# Patient Record
Sex: Male | Born: 1975 | Race: White | Hispanic: No | Marital: Married | Smoking: Current every day smoker
Health system: Southern US, Community
[De-identification: ages and names within clinical notes are randomized; demographics above are authoritative.]

## PROBLEM LIST (undated history)

## (undated) DIAGNOSIS — I10 Essential (primary) hypertension: Secondary | ICD-10-CM

## (undated) DIAGNOSIS — N200 Calculus of kidney: Secondary | ICD-10-CM

## (undated) DIAGNOSIS — E7800 Pure hypercholesterolemia, unspecified: Secondary | ICD-10-CM

## (undated) DIAGNOSIS — G4730 Sleep apnea, unspecified: Secondary | ICD-10-CM

## (undated) DIAGNOSIS — K439 Ventral hernia without obstruction or gangrene: Secondary | ICD-10-CM

## (undated) DIAGNOSIS — R0600 Dyspnea, unspecified: Secondary | ICD-10-CM

## (undated) DIAGNOSIS — K760 Fatty (change of) liver, not elsewhere classified: Secondary | ICD-10-CM

## (undated) DIAGNOSIS — Z87442 Personal history of urinary calculi: Secondary | ICD-10-CM

## (undated) HISTORY — PX: VASECTOMY: SHX75

## (undated) HISTORY — DX: Ventral hernia without obstruction or gangrene: K43.9

## (undated) HISTORY — DX: Fatty (change of) liver, not elsewhere classified: K76.0

## (undated) HISTORY — DX: Calculus of kidney: N20.0

## (undated) HISTORY — PX: WISDOM TOOTH EXTRACTION: SHX21

## (undated) HISTORY — PX: COLONOSCOPY: SHX174

---
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
## 2002-11-16 ENCOUNTER — Emergency Department (HOSPITAL_COMMUNITY): Admission: EM | Admit: 2002-11-16 | Discharge: 2002-11-16 | Payer: Self-pay | Admitting: Emergency Medicine

## 2005-01-23 ENCOUNTER — Ambulatory Visit: Admission: RE | Admit: 2005-01-23 | Discharge: 2005-01-23 | Payer: Self-pay | Admitting: Otolaryngology

## 2005-02-03 ENCOUNTER — Ambulatory Visit: Payer: Self-pay | Admitting: Pulmonary Disease

## 2005-12-18 ENCOUNTER — Ambulatory Visit: Payer: Self-pay | Admitting: Orthopedic Surgery

## 2005-12-28 ENCOUNTER — Encounter: Admission: RE | Admit: 2005-12-28 | Discharge: 2005-12-28 | Payer: Self-pay | Admitting: Orthopedic Surgery

## 2007-08-22 DIAGNOSIS — N200 Calculus of kidney: Secondary | ICD-10-CM

## 2007-08-22 HISTORY — DX: Calculus of kidney: N20.0

## 2007-12-14 ENCOUNTER — Emergency Department (HOSPITAL_COMMUNITY): Admission: EM | Admit: 2007-12-14 | Discharge: 2007-12-14 | Payer: Self-pay | Admitting: Emergency Medicine

## 2011-01-06 NOTE — Consult Note (Signed)
   NAME:  Johnathan Parsons, Johnathan Parsons                      ACCOUNT NO.:  [id number]   MEDICAL RECORD NO.:  [id number]                   PATIENT TYPE:  EMS   LOCATION:  ED                                   FACILITY:  [facility]   PHYSICIAN:  Dirk Dress. Katrinka Blazing, M.D.                DATE OF BIRTH:  03-22-1976   DATE OF CONSULTATION:  11/16/2002  DATE OF DISCHARGE:  11/16/2002                                   CONSULTATION   EMERGENCY ROOM SURGERY CONSULTATION:   HISTORY:  The patient is a 35 year old male with swelling of his right  perianal area and buttock since 11/13/2002.  He has had increasingly severe  pain with swelling.  He had no fever or chills and no drainage.  He was  evaluated in the emergency room and was found to have a large perirectal  abscess.  He was counseled for I&D under local anesthesia.  The area was  prepped and draped with Betadine.  Local infiltration with 1% Xylocaine was  carried out using 40 mL total.  The area was initially aspirated with  drainage of about 20 mL of thick tannish pus.  A #11-blade was then used to  drain into the abscess cavity.  Another 30 mL of pus was removed.  The area  was then flushed with peroxide and Betadine.  A 1/4-inch Penrose drain was  placed and sutured in place with 4-0 nylon.  Dressing was applied.   IMPRESSION:  Perirectal abscess.   PROCEDURE:  Incision and drainage of perirectal abscess.   RECOMMENDATION:  The patient is to wear peripad to collect the drainage.  He  is to have followup in the office on 11/19/2002.  He is given doxycycline  100 mg b.i.d. for 2 weeks.  He is advised to be out of work at least 7 days  starting today.                                               Dirk Dress. Katrinka Blazing, M.D.    LCS/MEDQ  D:  11/16/2002  T:  11/16/2002  Job:  [id number]

## 2011-01-06 NOTE — Procedures (Signed)
Johnathan Parsons, Johnathan Parsons NO.:  [id number]   MEDICAL RECORD NO.:  [id number]          PATIENT TYPE:  OUT   LOCATION:  SLEEP LAB                     FACILITY:  [facility]   PHYSICIAN:  Marcelyn Bruins, M.D. [facility] DATE OF BIRTH:  1976/05/13   DATE OF STUDY:  01/23/2005                              NOCTURNAL POLYSOMNOGRAM   REFERRING PHYSICIAN:  Suzanna Obey, M.D.   INDICATIONS:  Hypersomnia with sleep apnea.   SLEEP ARCHITECTURE:  The patient had total sleep time of 352 minutes with  absent slow wave sleep and decreased REM. Sleep onset latency was prolonged  at 40 minutes and REM latency was quite prolonged as well. Sleep efficiency  was 87%.   IMPRESSION:  1.  Split night study reveals mild obstructive sleep apnea/hypopnea syndrome      with 42 obstructive events noted in the first 154 minutes of sleep. This      gave the patient a respiratory disturbance index of 16 events per hour      and O2 desaturation as low as 86%. The events were not positional but      were associated with moderate to loud snoring. By protocol, the patient      was placed on a small Respironics nasal C-PAP mask and C-PAP titration      was initiated. At a pressure of 7 cm, the patient had no further      obstructive events but there was persistent mild snoring. I would      suggest a final treatment pressure of 8-10 cm depending upon clinical      response.  2.  No clinically significant cardiac arrhythmias.  3.  Moderate numbers of leg jerks with very mild sleep disruption.     ______________________________  Suzzette Righter    KC/MEDQ  D:  02/03/2005 15:51:45  T:  02/04/2005 06:53:24  Job:  [id number]

## 2011-05-16 LAB — URINALYSIS, ROUTINE W REFLEX MICROSCOPIC
Ketones, ur: NEGATIVE
Leukocytes, UA: NEGATIVE
Nitrite: NEGATIVE
Protein, ur: NEGATIVE
Urobilinogen, UA: 0.2
pH: 5

## 2011-05-16 LAB — URINE MICROSCOPIC-ADD ON

## 2011-12-03 ENCOUNTER — Encounter (HOSPITAL_COMMUNITY): Payer: Self-pay | Admitting: *Deleted

## 2011-12-03 ENCOUNTER — Emergency Department (HOSPITAL_COMMUNITY)
Admission: EM | Admit: 2011-12-03 | Discharge: 2011-12-04 | Disposition: A | Discharge status: Home / Self Care | Payer: BC Managed Care – PPO | Attending: Emergency Medicine | Admitting: Emergency Medicine

## 2011-12-03 DIAGNOSIS — F17200 Nicotine dependence, unspecified, uncomplicated: Secondary | ICD-10-CM | POA: Insufficient documentation

## 2011-12-03 DIAGNOSIS — T370X5A Adverse effect of sulfonamides, initial encounter: Secondary | ICD-10-CM | POA: Insufficient documentation

## 2011-12-03 DIAGNOSIS — T7840XA Allergy, unspecified, initial encounter: Secondary | ICD-10-CM

## 2011-12-03 DIAGNOSIS — L270 Generalized skin eruption due to drugs and medicaments taken internally: Secondary | ICD-10-CM

## 2011-12-03 DIAGNOSIS — R509 Fever, unspecified: Secondary | ICD-10-CM | POA: Insufficient documentation

## 2011-12-03 MED ORDER — METHYLPREDNISOLONE SODIUM SUCC 125 MG IJ SOLR
125.0000 mg | Freq: Once | INTRAMUSCULAR | Status: AC
  Administered 2011-12-03: 125 mg via INTRAVENOUS
  Filled 2011-12-03: qty 2

## 2011-12-03 MED ORDER — DIPHENHYDRAMINE HCL 50 MG/ML IJ SOLN
25.0000 mg | Freq: Once | INTRAMUSCULAR | Status: AC
  Administered 2011-12-03: 25 mg via INTRAVENOUS
  Filled 2011-12-03: qty 1

## 2011-12-03 MED ORDER — FAMOTIDINE 20 MG PO TABS
20.0000 mg | ORAL_TABLET | Freq: Once | ORAL | Status: AC
  Administered 2011-12-03: 20 mg via ORAL
  Filled 2011-12-03: qty 1

## 2011-12-03 NOTE — ED Provider Notes (Signed)
History   CSN: [id number]  Arrival date & time 12/03/11  2144   First MD Initiated Contact with Patient 12/03/11 2304      Chief Complaint  Patient presents with  . Rash    HPI  History provided by the patient. Patient is a 36 year old male with no significant past medical history presents with complaints of rash and possible drug reaction. Patient reports recently being put on Bactrim for a sinus infection by PCP. Patient has been taking this for the past or days. Last night patient began to develop a diffuse erythematous rash or skin. Rash progressed and is more prominent today over her entire body. Patient denies any itching, swelling or throat, swelling of lips or tongue, difficulty breathing or shortness of breath. Patient did try taking some Benadryl to help rash without significant improvements. Patient also stopped taking Bactrim today. Patient denies having similar reactions in the past. Patient has no known allergies. Patient does also report feeling some subjective fever and chills at home today but did not take his temperature. Patient has no other complaints.    History reviewed. No pertinent past medical history.  History reviewed. No pertinent past surgical history.  No family history on file.  History  Substance Use Topics  . Smoking status: Current Everyday Smoker  . Smokeless tobacco: Not on file  . Alcohol Use: No      Review of Systems  Constitutional: Positive for fever and chills. Negative for appetite change.  HENT: Negative for neck pain.   Respiratory: Negative for cough and shortness of breath.   Cardiovascular: Negative for chest pain.  Gastrointestinal: Negative for nausea and vomiting.  Skin: Positive for rash.  Neurological: Negative for dizziness, light-headedness and headaches.    Allergies  Tylox  Home Medications   Current Outpatient Rx  Name Route Sig Dispense Refill  . DIPHENHYDRAMINE HCL 25 MG PO CAPS Oral Take 25 mg by mouth every  6 (six) hours as needed. For allergies    . SULFAMETHOXAZOLE-TMP DS 800-160 MG PO TABS Oral Take 1 tablet by mouth 2 (two) times daily. Take until 12/06/11      BP 136/85  Pulse 106  Temp 98.7 F (37.1 C)  Resp 18  SpO2 98%  Physical Exam  Nursing note and vitals reviewed. Constitutional: He is oriented to person, place, and time. He appears well-developed and well-nourished. No distress.  HENT:  Head: Normocephalic and atraumatic.  Mouth/Throat: Oropharynx is clear and moist.       No swelling of lips or tongue.  Neck: Normal range of motion. Neck supple.       No swelling or mass.  Cardiovascular: Normal rate and regular rhythm.   Pulmonary/Chest: Effort normal and breath sounds normal. No stridor. No respiratory distress. He has no wheezes. He has no rales.  Neurological: He is alert and oriented to person, place, and time.  Skin: Skin is warm. Rash noted.       Diffuse erythematous macular rash over entire body sparing palms, soles and face.  Psychiatric: He has a normal mood and affect. His behavior is normal.    ED Course  Procedures       1. Drug rash   2. Allergic reaction caused by a drug       MDM  11:03 PM patient seen and evaluated. Patient in no acute distress. Patient with normal respirations and O2 sats. This is respiratory distress or compromise.        Angus Seller,  PA 12/04/11 0550

## 2011-12-03 NOTE — ED Notes (Signed)
No respiratory difficulty 

## 2011-12-03 NOTE — ED Notes (Signed)
The pt has had a red raised rash over his body since last pm.  He has been on bactrim ds for 4 days.  He feel very bad at present.  Fever chills and the rash

## 2011-12-04 NOTE — Discharge Instructions (Signed)
You were seen and treated today for your rash. Your providers today feel your rash was caused from use of Bactrim. Bactrim as an antibiotic that contains sulfur medication. It is advised that you avoid medications with sulfur or avoid taking Bactrim in the future. Please followup with your primary care provider to discuss this further. Continue to use Benadryl for symptoms at home. If you develop any worsening symptoms, swelling of the throat, difficulty breathing or shortness of breath return to the emergency room.  Drug Rash Skin reactions can be caused by several different drugs. Allergy to the medicine can cause itching, hives, and other rashes. Sun exposure causes a red rash with some medicines. Mononucleosis virus can cause a similar red rash when you are taking antibiotics. Sometimes, the rash may be accompanied by pain. The drug rash may happen with new drugs or with medicines that you have been taking for a while. The rash cannot be spread from person to person. In most cases, the symptoms of a drug rash are gone within a few days of stopping the medicine. Your rash, including hives (urticaria), is most likely from the following medicines:  Antibiotics or antimicrobials.   Anticonvulsants or seizure medicines.   Antihypertensives or blood pressure medicines.   Antimalarials.   Antidepressants or depression medicines.   Antianxiety drugs.   Diuretics or water pills.   Nonsteroidal anti-inflammatory drugs.   Simvastatin.   Lithium.   Omeprazole.   Allopurinol.   Pseudoephedrine.   Amiodarone.   Packed red blood cells, when you get a blood transfusion.   Contrast media, such as when getting an imaging test (CT or CAT scan).  This drug list is not all inclusive, but drug rashes have been reported with all the medicines listed above.Your caregiver will tell you which medicines to avoid. If you react to a medicine, a similar or worse reaction can occur the next time you take  it. If you need to stop taking an antibiotic because of a drug rash, an alternative antibiotic may be needed to get rid of your infection. Antihistamine or cortisone drugs may be prescribed to help relieve your symptoms. Stay out of the sun until the rash is completely gone.  Be sure to let your caregiver know about your drug reaction. Do not take this medicine in the future. Call your caregiver if your drug rash does not improve within 3 to 4 days. SEEK IMMEDIATE MEDICAL CARE IF:   You develop breathing problems, swelling in the throat, or wheezing.   You have weakness, fainting, fever, and muscle or joint pains.   You develop blisters or peeling of skin, especially around the mouth.  Document Released: 09/14/2004 Document Revised: 07/27/2011 Document Reviewed: 06/25/2008 [facility] Patient Information 2012 [place], [place].  RESOURCE GUIDE  Dental Problems  Patients with Medicaid: [facility] [phone] [address]                                           [phone] [address] Phone:  [phone]  Phone:  [phone]  If unable to pay or uninsured, contact:  [facility] or [facility]. to become qualified for the adult dental clinic.  Chronic Pain Problems Contact [facility]  [phone] Patients need to be referred by their primary care doctor.  Insufficient Money for Medicine Contact United Way:  call "211" or [facility] 430-727-2673.  No Primary Care Doctor Call [facility]  [phone] Other agencies that provide inexpensive medical care    [facility]  [phone]    [facility]  [phone]    [facility]  [phone]    [facility]  [phone]    [facility]  [phone]    [facility]  [phone]  Psychological Services [facility]  [phone] [facility]  [phone] [facility]   [phone] (emergency services [phone])  Substance Abuse Resources [facility]  [phone] [facility] [phone])[phone] [facility] [phone] Floydene Flock 726-120-8780 Residential & Outpatient Substance Abuse Program  [phone]  Abuse/Neglect [facility] Child Abuse Hotline 781-254-7213 [facility] Child Abuse Hotline (484) 704-9233 (After Hours)  Emergency Shelter [facility] [phone]  Maternity Homes Room at the [place] [facility] [place] [phone] [facility] [phone]  MRSA Hotline #:   765-268-7620    [facility] Resources  [facility] [place]     United Way                          [facility] [address] [place]                       [address]      [address]  Blondell Reveal Phone:  [phone]                                   Phone:  [phone]                 Phone:  [phone]  [facility] Mental Health Phone:  [phone]  [facility] Child Abuse Hotline 801-690-4058 [phone] (After Hours)

## 2011-12-04 NOTE — ED Provider Notes (Signed)
Medical screening examination/treatment/procedure(s) were performed by non-physician practitioner and as supervising physician I was immediately available for consultation/collaboration.   Reilley Latorre A Vanassa Penniman, MD 12/04/11 0642 

## 2013-02-04 ENCOUNTER — Telehealth: Payer: Self-pay | Admitting: Family Medicine

## 2013-02-04 NOTE — Telephone Encounter (Signed)
Spoke with patient and he needed to be seen right away.  Leaving for vacation. Offered him our first available new patient slot and he stated he would just go to Urgent Care so he could be seen before leaving for beach. bb

## 2013-02-04 NOTE — Telephone Encounter (Signed)
Did Dr. Lorin Picket accept this patient as his own? Patient says that his mother is Cora Daniels and that he had his records transferred here when Dr. Milford Cage left. We have no record of this up front.

## 2013-02-04 NOTE — Telephone Encounter (Signed)
Mom is calling back to check on status.. She said that he needs to be seen today so that he can meet them at the beach tomorrow.

## 2013-02-11 ENCOUNTER — Encounter: Payer: Self-pay | Admitting: Family Medicine

## 2013-02-11 ENCOUNTER — Ambulatory Visit (INDEPENDENT_AMBULATORY_CARE_PROVIDER_SITE_OTHER): Payer: BC Managed Care – PPO | Admitting: Family Medicine

## 2013-02-11 VITALS — BP 148/94 | Temp 98.3°F | Wt 235.4 lb

## 2013-02-11 DIAGNOSIS — J209 Acute bronchitis, unspecified: Secondary | ICD-10-CM

## 2013-02-11 NOTE — Progress Notes (Signed)
  Subjective:    Patient ID: Johnathan Parsons, male    DOB: 07/14/1976, 37 y.o.   MRN: [id number]  Cough This is a new problem. The current episode started in the past 7 days. The problem has been gradually worsening. The cough is productive of purulent sputum. Associated symptoms include chills. Nothing aggravates the symptoms. He has tried nothing for the symptoms. The treatment provided moderate relief.   One ppd, no wheeziness. Bad cough at night. Rob DM. Finished z pl. Advise by the ER that he had pneumonia  Pos exposure  Review of Systems  Constitutional: Positive for chills.  Respiratory: Positive for cough.        Objective:   Physical Exam Alert mild malaise. Vitals reviewed. Blood pressure improved on repeat. Lungs clear. Heart regular rate and rhythm. HEENT normal.       Assessment & Plan:  Impression 1 subacute bronchitis. Plan encouraged to stop smoking. Augmentin 875 twice a day 10 days. Hycodan 1 teaspoon each bedtime when necessary for cough. Symptomatic care discussed. WSL screening yearly wellness exams discussed.

## 2013-10-25 ENCOUNTER — Encounter (HOSPITAL_COMMUNITY): Payer: Self-pay | Admitting: Emergency Medicine

## 2013-10-25 ENCOUNTER — Emergency Department (INDEPENDENT_AMBULATORY_CARE_PROVIDER_SITE_OTHER): Payer: BC Managed Care – PPO

## 2013-10-25 ENCOUNTER — Emergency Department (HOSPITAL_COMMUNITY)
Admission: EM | Admit: 2013-10-25 | Discharge: 2013-10-25 | Disposition: A | Discharge status: Home / Self Care | Payer: BC Managed Care – PPO | Source: Home / Self Care | Attending: Family Medicine | Admitting: Family Medicine

## 2013-10-25 DIAGNOSIS — J069 Acute upper respiratory infection, unspecified: Secondary | ICD-10-CM

## 2013-10-25 DIAGNOSIS — J019 Acute sinusitis, unspecified: Secondary | ICD-10-CM

## 2013-10-25 MED ORDER — AMOXICILLIN 500 MG PO CAPS: 1000.0000 mg | ORAL_CAPSULE | Freq: Three times a day (TID) | ORAL | Status: DC

## 2013-10-25 MED ORDER — IBUPROFEN 800 MG PO TABS
800.0000 mg | ORAL_TABLET | Freq: Once | ORAL | Status: AC
  Administered 2013-10-25: 800 mg via ORAL

## 2013-10-25 MED ORDER — IBUPROFEN 800 MG PO TABS
ORAL_TABLET | ORAL | Status: AC
  Filled 2013-10-25: qty 1

## 2013-10-25 MED ORDER — HYDROCOD POLST-CHLORPHEN POLST 10-8 MG/5ML PO LQCR: 5.0000 mL | Freq: Two times a day (BID) | ORAL | Status: DC | PRN

## 2013-10-25 NOTE — Discharge Instructions (Signed)
You chest xray today is normal. Rest, drink lots of liquids and take medications as directed. If not improving in 48 hours please arrange follow up with your primary doctor.  Sinusitis Sinusitis is redness, soreness, and swelling (inflammation) of the paranasal sinuses. Paranasal sinuses are air pockets within the bones of your face (beneath the eyes, the middle of the forehead, or above the eyes). In healthy paranasal sinuses, mucus is able to drain out, and air is able to circulate through them by way of your nose. However, when your paranasal sinuses are inflamed, mucus and air can become trapped. This can allow bacteria and other germs to grow and cause infection. Sinusitis can develop quickly and last only a short time (acute) or continue over a long period (chronic). Sinusitis that lasts for more than 12 weeks is considered chronic.  CAUSES  Causes of sinusitis include:  Allergies.  Structural abnormalities, such as displacement of the cartilage that separates your nostrils (deviated septum), which can decrease the air flow through your nose and sinuses and affect sinus drainage.  Functional abnormalities, such as when the small hairs (cilia) that line your sinuses and help remove mucus do not work properly or are not present. SYMPTOMS  Symptoms of acute and chronic sinusitis are the same. The primary symptoms are pain and pressure around the affected sinuses. Other symptoms include:  Upper toothache.  Earache.  Headache.  Bad breath.  Decreased sense of smell and taste.  A cough, which worsens when you are lying flat.  Fatigue.  Fever.  Thick drainage from your nose, which often is green and may contain pus (purulent).  Swelling and warmth over the affected sinuses. DIAGNOSIS  Your caregiver will perform a physical exam. During the exam, your caregiver may:  Look in your nose for signs of abnormal growths in your nostrils (nasal polyps).  Tap over the affected sinus to  check for signs of infection.  View the inside of your sinuses (endoscopy) with a special imaging device with a light attached (endoscope), which is inserted into your sinuses. If your caregiver suspects that you have chronic sinusitis, one or more of the following tests may be recommended:  Allergy tests.  Nasal culture A sample of mucus is taken from your nose and sent to a lab and screened for bacteria.  Nasal cytology A sample of mucus is taken from your nose and examined by your caregiver to determine if your sinusitis is related to an allergy. TREATMENT  Most cases of acute sinusitis are related to a viral infection and will resolve on their own within 10 days. Sometimes medicines are prescribed to help relieve symptoms (pain medicine, decongestants, nasal steroid sprays, or saline sprays).  However, for sinusitis related to a bacterial infection, your caregiver will prescribe antibiotic medicines. These are medicines that will help kill the bacteria causing the infection.  Rarely, sinusitis is caused by a fungal infection. In theses cases, your caregiver will prescribe antifungal medicine. For some cases of chronic sinusitis, surgery is needed. Generally, these are cases in which sinusitis recurs more than 3 times per year, despite other treatments. HOME CARE INSTRUCTIONS   Drink plenty of water. Water helps thin the mucus so your sinuses can drain more easily.  Use a humidifier.  Inhale steam 3 to 4 times a day (for example, sit in the bathroom with the shower running).  Apply a warm, moist washcloth to your face 3 to 4 times a day, or as directed by your caregiver.  Use saline nasal sprays to help moisten and clean your sinuses.  Take over-the-counter or prescription medicines for pain, discomfort, or fever only as directed by your caregiver. SEEK IMMEDIATE MEDICAL CARE IF:  You have increasing pain or severe headaches.  You have nausea, vomiting, or drowsiness.  You have  swelling around your face.  You have vision problems.  You have a stiff neck.  You have difficulty breathing. MAKE SURE YOU:   Understand these instructions.  Will watch your condition.  Will get help right away if you are not doing well or get worse. Document Released: 08/07/2005 Document Revised: 10/30/2011 Document Reviewed: 08/22/2011 [facility] Patient Information 2014 [place].

## 2013-10-25 NOTE — ED Notes (Signed)
Onset Thursday of sinus congestion-but did not feel bad.  Friday-increased congestion and feeling tired.  Today generalized aches, cough, cough gives sensation of "pulling chest apart".  Non-productive cough.  Episodes of hot and cold sensation

## 2013-10-25 NOTE — ED Provider Notes (Signed)
CSN: [id number]     Arrival date & time 10/25/13  1639 History   First MD Initiated Contact with Patient 10/25/13 1818     Chief Complaint  Patient presents with  . URI    Patient is a 38 y.o. male presenting with URI. The history is provided by the patient.  URI Presenting symptoms: congestion, cough, facial pain, fatigue, fever, rhinorrhea and sore throat   Presenting symptoms: no ear pain   Severity:  Severe Onset quality:  Gradual Duration:  3 days Timing:  Constant Progression:  Worsening Chronicity:  New Relieved by:  Nothing Ineffective treatments:  OTC medications Associated symptoms: headaches, myalgias and sinus pain   Associated symptoms: no neck pain, no sneezing, no swollen glands and no wheezing   Risk factors: not elderly, no chronic cardiac disease, no chronic kidney disease, no chronic respiratory disease, no diabetes mellitus, no immunosuppression, no recent illness, no recent travel and no sick contacts     History reviewed. No pertinent past medical history. History reviewed. No pertinent past surgical history. No family history on file. History  Substance Use Topics  . Smoking status: Current Every Day Smoker  . Smokeless tobacco: Not on file  . Alcohol Use: Yes    Review of Systems  Constitutional: Positive for fever, chills and fatigue.  HENT: Positive for congestion, rhinorrhea and sore throat. Negative for ear pain and sneezing.   Eyes: Negative.   Respiratory: Positive for cough. Negative for wheezing.   Cardiovascular: Negative.   Gastrointestinal: Negative.   Endocrine: Negative.   Genitourinary: Negative.   Musculoskeletal: Positive for myalgias. Negative for neck pain.  Allergic/Immunologic: Negative.   Neurological: Positive for headaches.  Hematological: Negative.   Psychiatric/Behavioral: Negative.     Allergies  Oxycodone-acetaminophen and Sulphur  Home Medications   Current Outpatient Rx  Name  Route  Sig  Dispense  Refill  .  guaifenesin (ROBITUSSIN) 100 MG/5ML syrup   Oral   Take 200 mg by mouth 3 (three) times daily as needed for cough.         . Pseudoeph-Doxylamine-DM-APAP (NYQUIL PO)   Oral   Take by mouth.         . Pseudoephedrine-APAP-DM (DAYQUIL PO)   Oral   Take by mouth.         . terbinafine (LAMISIL) 250 MG tablet   Oral   Take 250 mg by mouth daily.         Marland Kitchen. amoxicillin (AMOXIL) 500 MG capsule   Oral   Take 2 capsules (1,000 mg total) by mouth 3 (three) times daily.   60 capsule   0   . chlorpheniramine-HYDROcodone (TUSSIONEX PENNKINETIC ER) 10-8 MG/5ML LQCR   Oral   Take 5 mLs by mouth every 12 (twelve) hours as needed for cough.   50 mL   0    BP 148/85  Pulse 123  Temp(Src) 100.1 F (37.8 C) (Oral)  Resp 18  SpO2 98% Physical Exam  Constitutional: He is oriented to person, place, and time. He appears well-developed and well-nourished.  Non-toxic appearance. He has a sickly appearance. He does not appear ill. No distress.  HENT:  Head: Normocephalic and atraumatic.  Right Ear: Tympanic membrane, external ear and ear canal normal.  Left Ear: Tympanic membrane and ear canal normal.  Nose: Mucosal edema and sinus tenderness present. Right sinus exhibits maxillary sinus tenderness and frontal sinus tenderness. Left sinus exhibits maxillary sinus tenderness and frontal sinus tenderness.  Mouth/Throat: Uvula is midline, oropharynx  is clear and moist and mucous membranes are normal.  Eyes: Conjunctivae are normal.  Neck: Neck supple.  Cardiovascular: Tachycardia present.   Pulmonary/Chest: Effort normal. He has no wheezes. He has no rales. He exhibits no tenderness.  LUL rhonchi that clears w/ coughing  Neurological: He is alert and oriented to person, place, and time.  Skin: Skin is warm and dry.  Psychiatric: He has a normal mood and affect.    ED Course  Procedures (including critical care time) Labs Review Labs Reviewed - No data to display Imaging Review Dg  Chest 2 View  10/25/2013   CLINICAL DATA:  Cough and fever for 2 days.  EXAM: CHEST  2 VIEW  COMPARISON:  02/04/2013  FINDINGS: The heart size and mediastinal contours are within normal limits. Both lungs are clear. The visualized skeletal structures are unremarkable.  IMPRESSION: No active cardiopulmonary disease.   Electronically Signed   By: Amie Portland M.D.   On: 10/25/2013 19:20     MDM   1. Acute sinusitis   2. URI (upper respiratory infection)    CXR neg for acute findings. HPI & PE c/w acute sinusitis associated w/ URI.  Will treat w/ Amoxicillin and short course of medication for cough. Pt encouraged to f/u w/ PCP if not improving in 48 hours. Pt agreeable w/ plan.    Leanne Chang, NP 10/25/13 2131

## 2013-10-27 NOTE — ED Provider Notes (Signed)
Medical screening examination/treatment/procedure(s) were performed by resident physician or non-physician practitioner and as supervising physician I was immediately available for consultation/collaboration.   Davis Ambrosini DOUGLAS MD.   Rhya Shan D Doninique Lwin, MD 10/27/13 2032 

## 2014-05-25 ENCOUNTER — Ambulatory Visit (INDEPENDENT_AMBULATORY_CARE_PROVIDER_SITE_OTHER): Payer: BC Managed Care – PPO | Admitting: Family Medicine

## 2014-05-25 ENCOUNTER — Encounter: Payer: Self-pay | Admitting: Family Medicine

## 2014-05-25 VITALS — Temp 99.1°F | Ht 71.0 in | Wt 241.0 lb

## 2014-05-25 DIAGNOSIS — R109 Unspecified abdominal pain: Secondary | ICD-10-CM

## 2014-05-25 LAB — POCT URINALYSIS DIPSTICK
Spec Grav, UA: 1.02
UROBILINOGEN UA: 4
pH, UA: 6

## 2014-05-25 MED ORDER — METRONIDAZOLE 500 MG PO TABS: 500.0000 mg | ORAL_TABLET | Freq: Three times a day (TID) | ORAL | Status: DC

## 2014-05-25 MED ORDER — CIPROFLOXACIN HCL 500 MG PO TABS: 500.0000 mg | ORAL_TABLET | Freq: Two times a day (BID) | ORAL | Status: DC

## 2014-05-25 NOTE — Progress Notes (Signed)
   Subjective:    Patient ID: Johnathan BayFloyd E Lucente Jr., male    DOB: 01/10/1976, 38 y.o.   MRN: [id number]  Abdominal Pain This is a new problem. The current episode started in the past 7 days. Pain location: low abd pain. Associated symptoms include a fever. The pain is aggravated by certain positions.   Moderate abdominal discomfort patient states over the past few days has never had this before. Denies rectal bleeding denies vomiting denies sweats or chills. States it does hurt to move some. Especially when he wrote his 4 wheeler PMH benign  Review of Systems  Constitutional: Positive for fever.  Gastrointestinal: Positive for abdominal pain.       Objective:   Physical Exam Lungs are clear no crackles heart normal pulses normal abdomen is soft with left lower quadrant abdominal discomfort no guarding or rebound extremities no edema skin warm dry Prostate exam normal      Assessment & Plan:  Probable diverticulitis-I recommend lab work. Also recommend Cipro twice a day Flagyl 3 times a day if ongoing trouble may need CAT scan. Recheck in the office in 3 days. Patient to go do lab work today will have results back by morning

## 2014-05-26 LAB — CBC WITH DIFFERENTIAL/PLATELET
BASOS ABS: 0 10*3/uL (ref 0.0–0.1)
Basophils Relative: 0 % (ref 0–1)
Eosinophils Absolute: 0.3 10*3/uL (ref 0.0–0.7)
Eosinophils Relative: 3 % (ref 0–5)
HEMATOCRIT: 43.7 % (ref 39.0–52.0)
HEMOGLOBIN: 14.9 g/dL (ref 13.0–17.0)
LYMPHS ABS: 3.3 10*3/uL (ref 0.7–4.0)
LYMPHS PCT: 31 % (ref 12–46)
MCH: 29.3 pg (ref 26.0–34.0)
MCHC: 34.1 g/dL (ref 30.0–36.0)
MCV: 86 fL (ref 78.0–100.0)
MONO ABS: 0.9 10*3/uL (ref 0.1–1.0)
MONOS PCT: 8 % (ref 3–12)
NEUTROS ABS: 6.2 10*3/uL (ref 1.7–7.7)
Neutrophils Relative %: 58 % (ref 43–77)
Platelets: 282 10*3/uL (ref 150–400)
RBC: 5.08 MIL/uL (ref 4.22–5.81)
RDW: 13 % (ref 11.5–15.5)
WBC: 10.7 10*3/uL — AB (ref 4.0–10.5)

## 2014-05-26 LAB — LIPASE: Lipase: 21 U/L (ref 0–75)

## 2014-05-26 LAB — HEPATIC FUNCTION PANEL
ALT: 35 U/L (ref 0–53)
AST: 21 U/L (ref 0–37)
Albumin: 4.1 g/dL (ref 3.5–5.2)
Alkaline Phosphatase: 94 U/L (ref 39–117)
BILIRUBIN DIRECT: 0.1 mg/dL (ref 0.0–0.3)
BILIRUBIN INDIRECT: 0.4 mg/dL (ref 0.2–1.2)
Total Bilirubin: 0.5 mg/dL (ref 0.2–1.2)
Total Protein: 6.5 g/dL (ref 6.0–8.3)

## 2014-05-26 LAB — BASIC METABOLIC PANEL
BUN: 10 mg/dL (ref 6–23)
CALCIUM: 9.2 mg/dL (ref 8.4–10.5)
CO2: 28 mEq/L (ref 19–32)
Chloride: 104 mEq/L (ref 96–112)
Creat: 0.97 mg/dL (ref 0.50–1.35)
Glucose, Bld: 69 mg/dL — ABNORMAL LOW (ref 70–99)
Potassium: 4.2 mEq/L (ref 3.5–5.3)
SODIUM: 140 meq/L (ref 135–145)

## 2014-05-28 ENCOUNTER — Encounter: Payer: Self-pay | Admitting: Family Medicine

## 2014-05-28 ENCOUNTER — Ambulatory Visit (INDEPENDENT_AMBULATORY_CARE_PROVIDER_SITE_OTHER): Payer: BC Managed Care – PPO | Admitting: Family Medicine

## 2014-05-28 VITALS — BP 122/90 | Ht 71.0 in | Wt 240.0 lb

## 2014-05-28 DIAGNOSIS — R350 Frequency of micturition: Secondary | ICD-10-CM

## 2014-05-28 DIAGNOSIS — R109 Unspecified abdominal pain: Secondary | ICD-10-CM

## 2014-05-28 DIAGNOSIS — Z139 Encounter for screening, unspecified: Secondary | ICD-10-CM

## 2014-05-28 LAB — POCT URINALYSIS DIPSTICK
Glucose, UA: NEGATIVE
KETONES UA: NEGATIVE
PH UA: 5
Protein, UA: NEGATIVE
Spec Grav, UA: <=1.005
Urobilinogen, UA: 2

## 2014-05-28 NOTE — Progress Notes (Signed)
   Subjective:    Patient ID: Johnathan BayFloyd E Burkley Jr., male    DOB: 1976/03/28, 38 y.o.   MRN: [id number]  HPI Patient is here for abdominal follow up. Patient states that his blood pressure has been elevated for the last week. Patient said his "bottom #'s" have been 150 & 108.  Patient also said his been urinating a lot more than usual lately. Urine sample collected. He relates the abdominal pain is not as severe as what it once was denies fever chills sweats nausea vomiting  Review of Systems  Constitutional: Negative for activity change, appetite change and fatigue.  HENT: Negative for congestion.   Respiratory: Negative for cough.   Cardiovascular: Negative for chest pain.  Gastrointestinal: Positive for abdominal pain.  Endocrine: Negative for polydipsia and polyphagia.  Neurological: Negative for weakness.  Psychiatric/Behavioral: Negative for confusion.       Objective:   Physical Exam  Vitals reviewed. Constitutional: He appears well-nourished. No distress.  Cardiovascular: Normal rate, regular rhythm and normal heart sounds.   No murmur heard. Pulmonary/Chest: Effort normal and breath sounds normal. No respiratory distress.  Abdominal: There is tenderness. There is no rebound and no guarding.  Musculoskeletal: He exhibits no edema.  Lymphadenopathy:    He has no cervical adenopathy.  Neurological: He is alert.  Psychiatric: His behavior is normal.          Assessment & Plan:  Increased urination recent lab work showed a normal glucose. Recommend anything other than monitoring. He is following up in several weeks  Elevated blood pressure reduce salt increase exercise no medications currently recheck his blood pressure on followup as well as exam  Lower abdominal pain probable diverticulitis somewhat improved if he is not 70% improved by next week or 100% improved by the time we see him he will need to have a CAT scan patient understands that  Repeat lab work  ordered

## 2014-05-28 NOTE — Patient Instructions (Signed)
DASH Eating Plan °DASH stands for "Dietary Approaches to Stop Hypertension." The DASH eating plan is a healthy eating plan that has been shown to reduce high blood pressure (hypertension). Additional health benefits may include reducing the risk of type 2 diabetes mellitus, heart disease, and stroke. The DASH eating plan may also help with weight loss. °WHAT DO I NEED TO KNOW ABOUT THE DASH EATING PLAN? °For the DASH eating plan, you will follow these general guidelines: °· Choose foods with a percent daily value for sodium of less than 5% (as listed on the food label). °· Use salt-free seasonings or herbs instead of table salt or sea salt. °· Check with your health care provider or pharmacist before using salt substitutes. °· Eat lower-sodium products, often labeled as "lower sodium" or "no salt added." °· Eat fresh foods. °· Eat more vegetables, fruits, and low-fat dairy products. °· Choose whole grains. Look for the word "whole" as the first word in the ingredient list. °· Choose fish and skinless chicken or turkey more often than red meat. Limit fish, poultry, and meat to 6 oz (170 g) each day. °· Limit sweets, desserts, sugars, and sugary drinks. °· Choose heart-healthy fats. °· Limit cheese to 1 oz (28 g) per day. °· Eat more home-cooked food and less restaurant, buffet, and fast food. °· Limit fried foods. °· Cook foods using methods other than frying. °· Limit canned vegetables. If you do use them, rinse them well to decrease the sodium. °· When eating at a restaurant, ask that your food be prepared with less salt, or no salt if possible. °WHAT FOODS CAN I EAT? °Seek help from a dietitian for individual calorie needs. °Grains °Whole grain or whole wheat bread. Brown rice. Whole grain or whole wheat pasta. Quinoa, bulgur, and whole grain cereals. Low-sodium cereals. Corn or whole wheat flour tortillas. Whole grain cornbread. Whole grain crackers. Low-sodium crackers. °Vegetables °Fresh or frozen vegetables  (raw, steamed, roasted, or grilled). Low-sodium or reduced-sodium tomato and vegetable juices. Low-sodium or reduced-sodium tomato sauce and paste. Low-sodium or reduced-sodium canned vegetables.  °Fruits °All fresh, canned (in natural juice), or frozen fruits. °Meat and Other Protein Products °Ground beef (85% or leaner), grass-fed beef, or beef trimmed of fat. Skinless chicken or turkey. Ground chicken or turkey. Pork trimmed of fat. All fish and seafood. Eggs. Dried beans, peas, or lentils. Unsalted nuts and seeds. Unsalted canned beans. °Dairy °Low-fat dairy products, such as skim or 1% milk, 2% or reduced-fat cheeses, low-fat ricotta or cottage cheese, or plain low-fat yogurt. Low-sodium or reduced-sodium cheeses. °Fats and Oils °Tub margarines without trans fats. Light or reduced-fat mayonnaise and salad dressings (reduced sodium). Avocado. Safflower, olive, or canola oils. Natural peanut or almond butter. °Other °Unsalted popcorn and pretzels. °The items listed above may not be a complete list of recommended foods or beverages. Contact your dietitian for more options. °WHAT FOODS ARE NOT RECOMMENDED? °Grains °White bread. White pasta. White rice. Refined cornbread. Bagels and croissants. Crackers that contain trans fat. °Vegetables °Creamed or fried vegetables. Vegetables in a cheese sauce. Regular canned vegetables. Regular canned tomato sauce and paste. Regular tomato and vegetable juices. °Fruits °Dried fruits. Canned fruit in light or heavy syrup. Fruit juice. °Meat and Other Protein Products °Fatty cuts of meat. Ribs, chicken wings, bacon, sausage, bologna, salami, chitterlings, fatback, hot dogs, bratwurst, and packaged luncheon meats. Salted nuts and seeds. Canned beans with salt. °Dairy °Whole or 2% milk, cream, half-and-half, and cream cheese. Whole-fat or sweetened yogurt. Full-fat   cheeses or blue cheese. Nondairy creamers and whipped toppings. Processed cheese, cheese spreads, or cheese  curds. °Condiments °Onion and garlic salt, seasoned salt, table salt, and sea salt. Canned and packaged gravies. Worcestershire sauce. Tartar sauce. Barbecue sauce. Teriyaki sauce. Soy sauce, including reduced sodium. Steak sauce. Fish sauce. Oyster sauce. Cocktail sauce. Horseradish. Ketchup and mustard. Meat flavorings and tenderizers. Bouillon cubes. Hot sauce. Tabasco sauce. Marinades. Taco seasonings. Relishes. °Fats and Oils °Butter, stick margarine, lard, shortening, ghee, and bacon fat. Coconut, palm kernel, or palm oils. Regular salad dressings. °Other °Pickles and olives. Salted popcorn and pretzels. °The items listed above may not be a complete list of foods and beverages to avoid. Contact your dietitian for more information. °WHERE CAN I FIND MORE INFORMATION? °[employer]: www.nhlbi.nih.gov/health/health-topics/topics/dash/ °Document Released: 07/27/2011 Document Revised: 12/22/2013 Document Reviewed: 06/11/2013 °[employer]® Patient Information ©2015 [employer]. This information is not intended to replace advice given to you by your health care provider. Make sure you discuss any questions you have with your health care provider. ° °

## 2014-05-30 LAB — CBC WITH DIFFERENTIAL/PLATELET
Basophils Absolute: 0 10*3/uL (ref 0.0–0.1)
Basophils Relative: 0 % (ref 0–1)
Eosinophils Absolute: 0.3 10*3/uL (ref 0.0–0.7)
Eosinophils Relative: 3 % (ref 0–5)
HEMATOCRIT: 45.5 % (ref 39.0–52.0)
HEMOGLOBIN: 15.4 g/dL (ref 13.0–17.0)
LYMPHS ABS: 2.3 10*3/uL (ref 0.7–4.0)
LYMPHS PCT: 25 % (ref 12–46)
MCH: 29.2 pg (ref 26.0–34.0)
MCHC: 33.8 g/dL (ref 30.0–36.0)
MCV: 86.2 fL (ref 78.0–100.0)
MONO ABS: 0.5 10*3/uL (ref 0.1–1.0)
MONOS PCT: 6 % (ref 3–12)
NEUTROS ABS: 6 10*3/uL (ref 1.7–7.7)
Neutrophils Relative %: 66 % (ref 43–77)
Platelets: 309 10*3/uL (ref 150–400)
RBC: 5.28 MIL/uL (ref 4.22–5.81)
RDW: 13.2 % (ref 11.5–15.5)
WBC: 9.1 10*3/uL (ref 4.0–10.5)

## 2014-05-30 LAB — LIPID PANEL
Cholesterol: 139 mg/dL (ref 0–200)
HDL: 28 mg/dL — AB (ref >39)
LDL CALC: 93 mg/dL (ref 0–99)
TRIGLYCERIDES: 90 mg/dL (ref <150)
Total CHOL/HDL Ratio: 5 Ratio
VLDL: 18 mg/dL (ref 0–40)

## 2014-06-11 ENCOUNTER — Encounter: Payer: Self-pay | Admitting: Family Medicine

## 2014-06-11 ENCOUNTER — Ambulatory Visit (INDEPENDENT_AMBULATORY_CARE_PROVIDER_SITE_OTHER): Payer: BC Managed Care – PPO | Admitting: Family Medicine

## 2014-06-11 VITALS — BP 150/98 | Ht 71.0 in | Wt 242.0 lb

## 2014-06-11 DIAGNOSIS — Z720 Tobacco use: Secondary | ICD-10-CM

## 2014-06-11 DIAGNOSIS — I10 Essential (primary) hypertension: Secondary | ICD-10-CM

## 2014-06-11 DIAGNOSIS — R1032 Left lower quadrant pain: Secondary | ICD-10-CM

## 2014-06-11 DIAGNOSIS — F17200 Nicotine dependence, unspecified, uncomplicated: Secondary | ICD-10-CM

## 2014-06-11 DIAGNOSIS — Z0000 Encounter for general adult medical examination without abnormal findings: Secondary | ICD-10-CM

## 2014-06-11 MED ORDER — LISINOPRIL 5 MG PO TABS: 5.0000 mg | ORAL_TABLET | Freq: Every day | ORAL | Status: DC

## 2014-06-11 NOTE — Patient Instructions (Addendum)
Smoking Cessation Quitting smoking is important to your health and has many advantages. However, it is not always easy to quit since nicotine is a very addictive drug. Oftentimes, people try 3 times or more before being able to quit. This document explains the best ways for you to prepare to quit smoking. Quitting takes hard work and a lot of effort, but you can do it. ADVANTAGES OF QUITTING SMOKING  You will live longer, feel better, and live better.  Your body will feel the impact of quitting smoking almost immediately.  Within 20 minutes, blood pressure decreases. Your pulse returns to its normal level.  After 8 hours, carbon monoxide levels in the blood return to normal. Your oxygen level increases.  After 24 hours, the chance of having a heart attack starts to decrease. Your breath, hair, and body stop smelling like smoke.  After 48 hours, damaged nerve endings begin to recover. Your sense of taste and smell improve.  After 72 hours, the body is virtually free of nicotine. Your bronchial tubes relax and breathing becomes easier.  After 2 to 12 weeks, lungs can hold more air. Exercise becomes easier and circulation improves.  The risk of having a heart attack, stroke, cancer, or lung disease is greatly reduced.  After 1 year, the risk of coronary heart disease is cut in half.  After 5 years, the risk of stroke falls to the same as a nonsmoker.  After 10 years, the risk of lung cancer is cut in half and the risk of other cancers decreases significantly.  After 15 years, the risk of coronary heart disease drops, usually to the level of a nonsmoker.  If you are pregnant, quitting smoking will improve your chances of having a healthy baby.  The people you live with, especially any children, will be healthier.  You will have extra money to spend on things other than cigarettes. QUESTIONS TO THINK ABOUT BEFORE ATTEMPTING TO QUIT You may want to talk about your answers with your  health care provider.  Why do you want to quit?  If you tried to quit in the past, what helped and what did not?  What will be the most difficult situations for you after you quit? How will you plan to handle them?  Who can help you through the tough times? Your family? Friends? A health care provider?  What pleasures do you get from smoking? What ways can you still get pleasure if you quit? Here are some questions to ask your health care provider:  How can you help me to be successful at quitting?  What medicine do you think would be best for me and how should I take it?  What should I do if I need more help?  What is smoking withdrawal like? How can I get information on withdrawal? GET READY  Set a quit date.  Change your environment by getting rid of all cigarettes, ashtrays, matches, and lighters in your home, car, or work. Do not let people smoke in your home.  Review your past attempts to quit. Think about what worked and what did not. GET SUPPORT AND ENCOURAGEMENT You have a better chance of being successful if you have help. You can get support in many ways.  Tell your family, friends, and coworkers that you are going to quit and need their support. Ask them not to smoke around you.  Get individual, group, or telephone counseling and support. Programs are available at local hospitals and health centers. Call   your local health department for information about programs in your area.  Spiritual beliefs and practices may help some smokers quit.  Download a "quit meter" on your computer to keep track of quit statistics, such as how long you have gone without smoking, cigarettes not smoked, and money saved.  Get a self-help book about quitting smoking and staying off tobacco. LEARN NEW SKILLS AND BEHAVIORS  Distract yourself from urges to smoke. Talk to someone, go for a walk, or occupy your time with a task.  Change your normal routine. Take a different route to work.  Drink tea instead of coffee. Eat breakfast in a different place.  Reduce your stress. Take a hot bath, exercise, or read a book.  Plan something enjoyable to do every day. Reward yourself for not smoking.  Explore interactive web-based programs that specialize in helping you quit. GET MEDICINE AND USE IT CORRECTLY Medicines can help you stop smoking and decrease the urge to smoke. Combining medicine with the above behavioral methods and support can greatly increase your chances of successfully quitting smoking.  Nicotine replacement therapy helps deliver nicotine to your body without the negative effects and risks of smoking. Nicotine replacement therapy includes nicotine gum, lozenges, inhalers, nasal sprays, and skin patches. Some may be available over-the-counter and others require a prescription.  Antidepressant medicine helps people abstain from smoking, but how this works is unknown. This medicine is available by prescription.  Nicotinic receptor partial agonist medicine simulates the effect of nicotine in your brain. This medicine is available by prescription. Ask your health care provider for advice about which medicines to use and how to use them based on your health history. Your health care provider will tell you what side effects to look out for if you choose to be on a medicine or therapy. Carefully read the information on the package. Do not use any other product containing nicotine while using a nicotine replacement product.  RELAPSE OR DIFFICULT SITUATIONS Most relapses occur within the first 3 months after quitting. Do not be discouraged if you start smoking again. Remember, most people try several times before finally quitting. You may have symptoms of withdrawal because your body is used to nicotine. You may crave cigarettes, be irritable, feel very hungry, cough often, get headaches, or have difficulty concentrating. The withdrawal symptoms are only temporary. They are strongest  when you first quit, but they will go away within 10-14 days. To reduce the chances of relapse, try to:  Avoid drinking alcohol. Drinking lowers your chances of successfully quitting.  Reduce the amount of caffeine you consume. Once you quit smoking, the amount of caffeine in your body increases and can give you symptoms, such as a rapid heartbeat, sweating, and anxiety.  Avoid smokers because they can make you want to smoke.  Do not let weight gain distract you. Many smokers will gain weight when they quit, usually less than 10 pounds. Eat a healthy diet and stay active. You can always lose the weight gained after you quit.  Find ways to improve your mood other than smoking. FOR MORE INFORMATION  www.smokefree.gov  Document Released: 08/01/2001 Document Revised: 12/22/2013 Document Reviewed: 11/16/2011 [facility] Patient Information 2015 [place], [place]. This information is not intended to    DASH Eating Plan DASH stands for "Dietary Approaches to Stop Hypertension." The DASH eating plan is a healthy eating plan that has been shown to reduce high blood pressure (hypertension). Additional health benefits may include reducing the risk of type 2 diabetes  mellitus, heart disease, and stroke. The DASH eating plan may also help with weight loss. WHAT DO I NEED TO KNOW ABOUT THE DASH EATING PLAN? For the DASH eating plan, you will follow these general guidelines:  Choose foods with a percent daily value for sodium of less than 5% (as listed on the food label).  Use salt-free seasonings or herbs instead of table salt or sea salt.  Check with your health care provider or pharmacist before using salt substitutes.  Eat lower-sodium products, often labeled as "lower sodium" or "no salt added."  Eat fresh foods.  Eat more vegetables, fruits, and low-fat dairy products.  Choose whole grains. Look for the word "whole" as the first word in the ingredient list.  Choose fish and skinless chicken or  [place]key more often than red meat. Limit fish, poultry, and meat to 6 oz (170 g) each day.  Limit sweets, desserts, sugars, and sugary drinks.  Choose heart-healthy fats.  Limit cheese to 1 oz (28 g) per day.  Eat more home-cooked food and less restaurant, buffet, and fast food.  Limit fried foods.  Cook foods using methods other than frying.  Limit canned vegetables. If you do use them, rinse them well to decrease the sodium.  When eating at a restaurant, ask that your food be prepared with less salt, or no salt if possible. WHAT FOODS CAN I EAT? Seek help from a dietitian for individual calorie needs. Grains Whole grain or whole wheat bread. Brown rice. Whole grain or whole wheat pasta. Quinoa, bulgur, and whole grain cereals. Low-sodium cereals. Corn or whole wheat flour tortillas. Whole grain cornbread. Whole grain crackers. Low-sodium crackers. Vegetables Fresh or frozen vegetables (raw, steamed, roasted, or grilled). Low-sodium or reduced-sodium tomato and vegetable juices. Low-sodium or reduced-sodium tomato sauce and paste. Low-sodium or reduced-sodium canned vegetables.  Fruits All fresh, canned (in natural juice), or frozen fruits. Meat and Other Protein Products Ground beef (85% or leaner), grass-fed beef, or beef trimmed of fat. Skinless chicken or [place]key. Ground chicken or [place]key. Pork trimmed of fat. All fish and seafood. Eggs. Dried beans, peas, or lentils. Unsalted nuts and seeds. Unsalted canned beans. Dairy Low-fat dairy products, such as skim or 1% milk, 2% or reduced-fat cheeses, low-fat ricotta or cottage cheese, or plain low-fat yogurt. Low-sodium or reduced-sodium cheeses. Fats and Oils Tub margarines without trans fats. Light or reduced-fat mayonnaise and salad dressings (reduced sodium). Avocado. Safflower, olive, or canola oils. Natural peanut or almond butter. Other Unsalted popcorn and pretzels. The items listed above may not be a complete list of  recommended foods or beverages. Contact your dietitian for more options. WHAT FOODS ARE NOT RECOMMENDED? Grains White bread. White pasta. White rice. Refined cornbread. Bagels and croissants. Crackers that contain trans fat. Vegetables Creamed or fried vegetables. Vegetables in a cheese sauce. Regular canned vegetables. Regular canned tomato sauce and paste. Regular tomato and vegetable juices. Fruits Dried fruits. Canned fruit in light or heavy syrup. Fruit juice. Meat and Other Protein Products Fatty cuts of meat. Ribs, chicken wings, bacon, sausage, bologna, salami, chitterlings, fatback, hot dogs, bratwurst, and packaged luncheon meats. Salted nuts and seeds. Canned beans with salt. Dairy Whole or 2% milk, cream, half-and-half, and cream cheese. Whole-fat or sweetened yogurt. Full-fat cheeses or blue cheese. Nondairy creamers and whipped toppings. Processed cheese, cheese spreads, or cheese curds. Condiments Onion and garlic salt, seasoned salt, table salt, and sea salt. Canned and packaged gravies. Worcestershire sauce. Tartar sauce. Barbecue sauce. Teriyaki sauce. Soy  sauce, including reduced sodium. Steak sauce. Fish sauce. Oyster sauce. Cocktail sauce. Horseradish. Ketchup and mustard. Meat flavorings and tenderizers. Bouillon cubes. Hot sauce. Tabasco sauce. Marinades. Taco seasonings. Relishes. Fats and Oils Butter, stick margarine, lard, shortening, ghee, and bacon fat. Coconut, palm kernel, or palm oils. Regular salad dressings. Other Pickles and olives. Salted popcorn and pretzels. The items listed above may not be a complete list of foods and beverages to avoid. Contact your dietitian for more information. WHERE CAN I FIND MORE INFORMATION? National Heart, Lung, and Blood Institute: CableProm[employer].itwww.nhlbi.nih.gov/health/health-topics/topics/dash/ Document Released: 07/27/2011 Document Revised: 12/22/2013 Document Reviewed: 06/11/2013 [facility] Patient Information 2015 [place], [place]. This  information is not intended to replace advice given to you by your health care provider. Make sure you discuss any questions you have with your health care provider. Hypertension Hypertension, commonly called high blood pressure, is when the force of blood pumping through your arteries is too strong. Your arteries are the blood vessels that carry blood from your heart throughout your body. A blood pressure reading consists of a higher number over a lower number, such as 110/72. The higher number (systolic) is the pressure inside your arteries when your heart pumps. The lower number (diastolic) is the pressure inside your arteries when your heart relaxes. Ideally you want your blood pressure below 120/80. Hypertension forces your heart to work harder to pump blood. Your arteries may become narrow or stiff. Having hypertension puts you at risk for heart disease, stroke, and other problems.  RISK FACTORS Some risk factors for high blood pressure are controllable. Others are not.  Risk factors you cannot control include:   Race. You may be at higher risk if you are African American.  Age. Risk increases with age.  Gender. Men are at higher risk than women before age 40 years. After age 40, women are at higher risk than men. Risk factors you can control include:  Not getting enough exercise or physical activity.  Being overweight.  Getting too much fat, sugar, calories, or salt in your diet.  Drinking too much alcohol. SIGNS AND SYMPTOMS Hypertension does not usually cause signs or symptoms. Extremely high blood pressure (hypertensive crisis) may cause headache, anxiety, shortness of breath, and nosebleed. DIAGNOSIS  To check if you have hypertension, your health care provider will measure your blood pressure while you are seated, with your arm held at the level of your heart. It should be measured at least twice using the same arm. Certain conditions can cause a difference in blood pressure  between your right and left arms. A blood pressure reading that is higher than normal on one occasion does not mean that you need treatment. If one blood pressure reading is high, ask your health care provider about having it checked again. TREATMENT  Treating high blood pressure includes making lifestyle changes and possibly taking medicine. Living a healthy lifestyle can help lower high blood pressure. You may need to change some of your habits. Lifestyle changes may include:  Following the DASH diet. This diet is high in fruits, vegetables, and whole grains. It is low in salt, red meat, and added sugars.  Getting at least 2 hours of brisk physical activity every week.  Losing weight if necessary.  Not smoking.  Limiting alcoholic beverages.  Learning ways to reduce stress. If lifestyle changes are not enough to get your blood pressure under control, your health care provider may prescribe medicine. You may need to take more than one. Work closely with your health  care provider to understand the risks and benefits. HOME CARE INSTRUCTIONS  Have your blood pressure rechecked as directed by your health care provider.   Take medicines only as directed by your health care provider. Follow the directions carefully. Blood pressure medicines must be taken as prescribed. The medicine does not work as well when you skip doses. Skipping doses also puts you at risk for problems.   Do not smoke.   Monitor your blood pressure at home as directed by your health care provider. SEEK MEDICAL CARE IF:   You think you are having a reaction to medicines taken.  You have recurrent headaches or feel dizzy.  You have swelling in your ankles.  You have trouble with your vision. SEEK IMMEDIATE MEDICAL CARE IF:  You develop a severe headache or confusion.  You have unusual weakness, numbness, or feel faint.  You have severe chest or abdominal pain.  You vomit repeatedly.  You have trouble  breathing. MAKE SURE YOU:   Understand these instructions.  Will watch your condition.  Will get help right away if you are not doing well or get worse. Document Released: 08/07/2005 Document Revised: 12/22/2013 Document Reviewed: 05/30/2013 [facility] Patient Information 2015 [place], [place]. This information is not intended to replace advice given to you by your health care provider. Make sure you discuss any questions you have with your health care provider. replace advice given to you by your health care provider. Make sure you discuss any questions you have with your health care provider.

## 2014-06-11 NOTE — Progress Notes (Signed)
   Subjective:    Patient ID: Johnathan BayFloyd E Hlavacek Jr., male    DOB: [birth year], 38 y.o.   MRN: [id number]  HPI The patient comes in today for a wellness visit.  A review of their health history was completed.  A review of medications was also completed.  Any needed refills: None  Eating habits: Not health conscious  Falls/  MVA accidents in past few months: None  Regular exercise: "A lot of walking"  Specialist pt sees on regular basis: No  Preventative health issues were discussed.   Additional concerns: None  Had flu vaccine through work  Stress levels good  He does state his blood pressure is up occasionally he does try to minimize salt in his diet. He does state that he should quit smoking but he just is not motivated to do so currently he was well counseled today regarding the importance of quitting   Review of Systems  Constitutional: Negative for fever, activity change and appetite change.  HENT: Negative for congestion and rhinorrhea.   Eyes: Negative for discharge.  Respiratory: Negative for cough and wheezing.   Cardiovascular: Negative for chest pain.  Gastrointestinal: Negative for vomiting, abdominal pain and blood in stool.  Genitourinary: Negative for frequency and difficulty urinating.  Musculoskeletal: Negative for neck pain.  Skin: Negative for rash.  Allergic/Immunologic: Negative for environmental allergies and food allergies.  Neurological: Negative for weakness and headaches.  Psychiatric/Behavioral: Negative for agitation.       Objective:   Physical Exam  Constitutional: He appears well-developed and well-nourished.  HENT:  Head: Normocephalic and atraumatic.  Right Ear: External ear normal.  Left Ear: External ear normal.  Nose: Nose normal.  Mouth/Throat: Oropharynx is clear and moist.  Eyes: EOM are normal. Pupils are equal, round, and reactive to light.  Neck: Normal range of motion. Neck supple. No thyromegaly present.  Cardiovascular:  Normal rate, regular rhythm and normal heart sounds.   No murmur heard. Pulmonary/Chest: Effort normal and breath sounds normal. No respiratory distress. He has no wheezes.  Abdominal: Soft. Bowel sounds are normal. He exhibits no distension and no mass. There is no tenderness.  Genitourinary: Penis normal.  Musculoskeletal: Normal range of motion. He exhibits no edema.  Lymphadenopathy:    He has no cervical adenopathy.  Neurological: He is alert. He exhibits normal muscle tone.  Skin: Skin is warm and dry. No erythema.  Psychiatric: He has a normal mood and affect. His behavior is normal. Judgment normal.          Assessment & Plan:  Wellness exam completed today overall doing fine targeting the importance of quitting smoking exercising watching diet staying physically active  HTN start lisinopril 5 mg daily follow him up in 6 weeks' time  Lower abdominal discomfort is gone he states though he is having some times where he feels funny in his lower abdomen with an occasional loose stool if this gets worse she will notify us we will set him up with gastroenterology otherwise we'll recheck in 6 weeks' time I do not feel this patient needs CT scan

## 2014-06-18 LAB — POC HEMOCCULT BLD/STL (HOME/3-CARD/SCREEN)
Card #2 Fecal Occult Blod, POC: NEGATIVE
Card #3 Fecal Occult Blood, POC: NEGATIVE
Fecal Occult Blood, POC: NEGATIVE

## 2014-07-23 ENCOUNTER — Ambulatory Visit: Payer: BC Managed Care – PPO | Admitting: Family Medicine

## 2014-09-09 ENCOUNTER — Ambulatory Visit (INDEPENDENT_AMBULATORY_CARE_PROVIDER_SITE_OTHER): Payer: BLUE CROSS/BLUE SHIELD | Admitting: Family Medicine

## 2014-09-09 ENCOUNTER — Encounter: Payer: Self-pay | Admitting: Family Medicine

## 2014-09-09 VITALS — BP 140/88 | Ht 71.0 in | Wt 246.2 lb

## 2014-09-09 DIAGNOSIS — I10 Essential (primary) hypertension: Secondary | ICD-10-CM

## 2014-09-09 DIAGNOSIS — R1030 Lower abdominal pain, unspecified: Secondary | ICD-10-CM

## 2014-09-09 MED ORDER — LISINOPRIL 5 MG PO TABS: 5.0000 mg | ORAL_TABLET | Freq: Every day | ORAL | Status: DC

## 2014-09-09 NOTE — Progress Notes (Signed)
   Subjective:    Patient ID: Johnathan BayFloyd E Roscher Jr., male    DOB: 1975/09/03, 39 y.o.   MRN: [id number]  HPI Patient does try to maintain low salt diet is not exercising a whole lot. Has not brought his weight down much at all. Denies any chest tightness pressure pain shortness of breath.   Patient arrives for a follow up on blood pressure. Patient still having problems with lower abd pain and pain in right testicle when lifting and straining.  Please see discussion below. Review of Systems  Constitutional: Negative for activity change, appetite change and fatigue.  HENT: Negative for congestion.   Respiratory: Negative for cough.   Cardiovascular: Negative for chest pain.  Gastrointestinal: Negative for abdominal pain.  Endocrine: Negative for polydipsia and polyphagia.  Neurological: Negative for weakness.  Psychiatric/Behavioral: Negative for confusion.       Objective:   Physical Exam  Constitutional: He appears well-nourished. No distress.  Cardiovascular: Normal rate, regular rhythm and normal heart sounds.   No murmur heard. Pulmonary/Chest: Effort normal and breath sounds normal. No respiratory distress.  Musculoskeletal: He exhibits no edema.  Lymphadenopathy:    He has no cervical adenopathy.  Neurological: He is alert.  Psychiatric: His behavior is normal.  Vitals reviewed.         Assessment & Plan:  HTN good control continue current measures low-salt regular physical activity try to bring weight down 10-15 pounds over the next 6 months recheck in 6 months  Lower pelvic pain over the past 2 months constant daily pain more often on the right than the left sometimes radiates into the scrotum feels very painful uncomfortable will last off and on throughout the whole day worse with activity also relates feels like he has to urinate at times other times feels like he can't fully defecate. He was put on around the medication back in October for presumptive  diverticulitis it did not get better. He is also tried anti-inflammatories as well as changing diet without help. Denies blood in stool Hemoccult cards were negative blood work about a month and a half ago negative I believe this patient would benefit from having the CT of the abdomen and pelvis with contrast as well as referral to urology. We will go ahead and set this up.

## 2014-09-09 NOTE — Addendum Note (Signed)
Addended by: Lilyan PuntLUKING, Francetta Ilg A on: 09/09/2014 12:38 PM   Modules accepted: Orders

## 2014-09-14 ENCOUNTER — Telehealth: Payer: Self-pay | Admitting: Family Medicine

## 2014-09-14 ENCOUNTER — Encounter (HOSPITAL_COMMUNITY): Payer: Self-pay

## 2014-09-14 ENCOUNTER — Ambulatory Visit (HOSPITAL_COMMUNITY)
Admission: RE | Admit: 2014-09-14 | Discharge: 2014-09-14 | Disposition: A | Discharge status: Home / Self Care | Payer: BLUE CROSS/BLUE SHIELD | Source: Ambulatory Visit | Attending: Family Medicine | Admitting: Family Medicine

## 2014-09-14 DIAGNOSIS — R1030 Lower abdominal pain, unspecified: Secondary | ICD-10-CM | POA: Insufficient documentation

## 2014-09-14 HISTORY — DX: Essential (primary) hypertension: I10

## 2014-09-14 MED ORDER — IOHEXOL 300 MG/ML  SOLN
100.0000 mL | Freq: Once | INTRAMUSCULAR | Status: AC | PRN
  Administered 2014-09-14: 1 mL via INTRAVENOUS

## 2014-09-14 NOTE — Telephone Encounter (Signed)
[place] Dr. Lovell SheehanJenkins.

## 2014-09-15 ENCOUNTER — Other Ambulatory Visit: Payer: Self-pay

## 2014-09-15 ENCOUNTER — Encounter: Payer: Self-pay | Admitting: Physician Assistant

## 2014-09-15 DIAGNOSIS — R1030 Lower abdominal pain, unspecified: Secondary | ICD-10-CM

## 2014-09-16 NOTE — Telephone Encounter (Signed)
#  15 Arty [facility] with the patient as well [facility] They recommend that this patient be seen by gastroenterology for further evaluation. The [place]. The patient is aware that we are setting this up.

## 2014-09-18 ENCOUNTER — Encounter: Payer: Self-pay | Admitting: *Deleted

## 2014-09-21 ENCOUNTER — Encounter: Payer: Self-pay | Admitting: Physician Assistant

## 2014-09-21 ENCOUNTER — Other Ambulatory Visit (INDEPENDENT_AMBULATORY_CARE_PROVIDER_SITE_OTHER): Payer: BLUE CROSS/BLUE SHIELD

## 2014-09-21 ENCOUNTER — Ambulatory Visit (INDEPENDENT_AMBULATORY_CARE_PROVIDER_SITE_OTHER): Payer: BLUE CROSS/BLUE SHIELD | Admitting: Physician Assistant

## 2014-09-21 VITALS — BP 142/82 | HR 84 | Ht 71.0 in | Wt 241.5 lb

## 2014-09-21 DIAGNOSIS — R1030 Lower abdominal pain, unspecified: Secondary | ICD-10-CM

## 2014-09-21 LAB — CBC WITH DIFFERENTIAL/PLATELET
BASOS ABS: 0 10*3/uL (ref 0.0–0.1)
Basophils Relative: 0.3 % (ref 0.0–3.0)
EOS ABS: 0.2 10*3/uL (ref 0.0–0.7)
EOS PCT: 1.9 % (ref 0.0–5.0)
HEMATOCRIT: 43.8 % (ref 39.0–52.0)
HEMOGLOBIN: 15.2 g/dL (ref 13.0–17.0)
LYMPHS ABS: 2.8 10*3/uL (ref 0.7–4.0)
Lymphocytes Relative: 33.2 % (ref 12.0–46.0)
MCHC: 34.8 g/dL (ref 30.0–36.0)
MCV: 84.2 fl (ref 78.0–100.0)
MONO ABS: 0.6 10*3/uL (ref 0.1–1.0)
Monocytes Relative: 7.3 % (ref 3.0–12.0)
NEUTROS PCT: 57.3 % (ref 43.0–77.0)
Neutro Abs: 4.8 10*3/uL (ref 1.4–7.7)
PLATELETS: 238 10*3/uL (ref 150.0–400.0)
RBC: 5.2 Mil/uL (ref 4.22–5.81)
RDW: 13.4 % (ref 11.5–15.5)
WBC: 8.4 10*3/uL (ref 4.0–10.5)

## 2014-09-21 NOTE — Progress Notes (Signed)
Agree w/ Ms. Hvozdovic's note and mangement.  

## 2014-09-21 NOTE — Progress Notes (Signed)
Patient ID: Johnathan BayFloyd E Losurdo Jr., male   DOB: 06/12/1976, 39 y.o.   MRN: [id number]    HPI:   Johnathan Parsons is a 39 year old male referred for evaluation by Dr. Gerda DissLuking due to lower abdominal pain.  Johnathan Parsons says that the week of Thanksgiving he was out hunting when he "jumped a creek" and landed on both feet very firmly. Later that day he developed lower abdominal pain. For the first few weeks, his pain would be worse whenever he lifted his right leg. That has improved, however he states since the onset of this pain he has had constant rectal pressure. His stools are formed and they have no mucus. He has no bright red blood per rectum or melena. He feels like he never fully evacuates. We're he used to have 1 bowel movement a day he is now frequently having days of 3 or 4 soft formed bowel movement. He reports that in October he was empirically treated for diverticulitis and says it was a different type of pain that he had at that time. He has had no change in his appetite, and he has no weight loss. He has no fever, chills, or night sweats. His abdominal pain is not associated with any unusual I pain, joint pain, oral ulcers, or skin rashes. Excess use of nonsteroidal anti-inflammatories. He [place] in [place] and has well water. He was evaluated by his primary care physician and sent for a CT scan that showed an area of intussusception in the proximal jejunum without obstruction or bowel dilatation focally he was referred to surgery who reviewed his films and felt it was not a surgical issue and that he would  benefit from seeing GI. He has no associated nausea or vomiting. His pain is not exacerbated or alleviated with ingestion of food, nor is it alleviated with defecation or passage of gas. Patient reports that his father is 3558 and recently had colon polyps removed.  Past Medical History  Diagnosis Date  . Hypertension   . Ventral hernia   . Hepatic steatosis   . Kidney stones 2009    Past Surgical History    Procedure Laterality Date  . Wisdom tooth extraction     Family History  Problem Relation Age of Onset  . Hypertension Father   . Diabetes Father   . Hypertension Maternal Grandmother   . Heart disease Maternal Grandmother   . Hypertension Maternal Grandfather   . Hypertension Paternal Grandmother   . Hypertension Paternal Grandfather   . Heart disease Father   . Liver cancer Maternal Grandmother   . Pancreatic cancer Paternal Grandfather   . Prostate cancer Maternal Grandfather   . Colon cancer Maternal Grandfather   . Colon polyps Father   . Liver disease Father   . Kidney disease Maternal Grandmother   . Gallbladder disease Neg Hx   . Esophageal cancer Neg Hx    History  Substance Use Topics  . Smoking status: Current Every Day Smoker -- 1.00 packs/day for 25 years    Types: Cigarettes  . Smokeless tobacco: Never Used  . Alcohol Use: 0.0 oz/week    0 Not specified per week     Comment: Occassionally   Current Outpatient Prescriptions  Medication Sig Dispense Refill  . lisinopril (PRINIVIL,ZESTRIL) 5 MG tablet Take 1 tablet (5 mg total) by mouth daily. 30 tablet 6  . omeprazole (PRILOSEC) 10 MG capsule Take 10 mg by mouth as needed.    . terbinafine (LAMISIL) 250 MG tablet Take 250  mg by mouth daily.   0   No current facility-administered medications for this visit.   Allergies  Allergen Reactions  . Oxycodone-Acetaminophen Itching  . Sulfa Antibiotics Hives     Review of Systems: Gen: Denies any fever, chills, sweats, anorexia, fatigue, weakness, malaise, weight loss, and sleep disorder CV: Denies chest pain, angina, palpitations, syncope, orthopnea, PND, peripheral edema, and claudication. Resp: Denies dyspnea at rest, dyspnea with exercise, cough, sputum, wheezing, coughing up blood, and pleurisy. GI: Denies vomiting blood, jaundice, and fecal incontinence.   Denies dysphagia or odynophagia. GU : Denies urinary burning, blood in urine, urinary frequency,  urinary hesitancy, nocturnal urination, and urinary incontinence. MS: Denies joint pain, limitation of movement, and swelling, stiffness, low back pain, extremity pain. Denies muscle weakness, cramps, atrophy.  Derm: Denies rash, itching, dry skin, hives, moles, warts, or unhealing ulcers.  Psych: Denies depression, anxiety, memory loss, suicidal ideation, hallucinations, paranoia, and confusion. Heme: Denies bruising, bleeding, and enlarged lymph nodes. Neuro:  Denies any headaches, dizziness, paresthesias. Endo:  Denies any problems with DM, thyroid, adrenal function  Studies: Ct Abdomen Pelvis W Contrast  09/14/2014   CLINICAL DATA:  Two-month history of lower abdominal pain radiating toward scrotum  EXAM: CT ABDOMEN AND PELVIS WITH CONTRAST  TECHNIQUE: Multidetector CT imaging of the abdomen and pelvis was performed using the standard protocol following bolus administration of intravenous contrast. Oral contrast was also administered.  CONTRAST:  1mL OMNIPAQUE IOHEXOL 300 MG/ML  SOLN  COMPARISON:  December 14, 2007  FINDINGS: There is a tiny calcified granuloma in the posterior left base. Lung bases are otherwise clear.  Liver is prominent, measuring 19.1 cm in length. There is hepatic steatosis. No focal liver lesions are identified. Gallbladder wall is not thickened. There is no appreciable biliary duct dilatation.  Spleen, pancreas, and adrenals appear normal. A small splenule is noted slightly posterior to the pancreatic tail. Kidneys bilaterally show no mass or hydronephrosis on either side. There is no renal or ureteral calculus on either side.  In the pelvis, the urinary bladder is midline with normal wall thickness. There is no pelvic mass or fluid collection. Appendix appears normal. No lesions are identified in either inguinal region.  There is no bowel obstruction.  No free air or portal venous air.  There is a minimal ventral hernia containing only fat.  There is a focal area of  intussusception in the proximal jejunum, best seen on axial slices 58 through 61, series 2; coronal slice is 81 through 89 series 5; sagittal slice is 105 through 113 series 6. Contrast flows freely through this area, and bowel is not appreciably dilated in this area. The significance of this finding is questionable.  There is no ascites, adenopathy, or abscess in the abdomen or pelvis. There is no demonstrable abdominal aortic aneurysm. There are no blastic or lytic bone lesions.  IMPRESSION: There is an area of intussusception in the proximal jejunum without obstruction or bowel dilatation focally in this area. The significance of this finding is uncertain. This finding could be indicative of a viral type enteritis or may merely represent a transient phenomenon of doubtful clinical significance. If patient's symptoms persist, correlation with dedicated CT enterography may be reasonable given this finding.  Minimal ventral hernia containing only fat.  No bowel obstruction.  No abscess.  Appendix appears normal.  Prominent liver with hepatic steatosis.   Electronically Signed   By: Bretta Bang M.D.   On: 09/14/2014 12:39    LAB RESULTS:  Recent Labs  09/21/14 1003  WBC 8.4  HGB 15.2  HCT 43.8  PLT 238.0   Fecal occult blood cards on 06/18/2014 were negative   Physical Exam: BP 142/82 mmHg  Pulse 84  Ht  (1.803 m)  Wt 241 lb 8 oz (109.544 kg)  BMI 33.70 kg/m2 Constitutional: Pleasant,well-developedmale in no acute distress. HEENT: Normocephalic and atraumatic. Conjunctivae are normal. No scleral icterus. Neck supple. No thyromegaly Cardiovascular: Normal rate, regular rhythm.  Pulmonary/chest: Effort normal and breath sounds normal. No wheezing, rales or rhonchi. Abdominal: Soft, nondistended, mild diffuse lower abd tenderness with no rebound or guarding. Bowel sounds active throughout. There are no masses palpable. No hepatomegaly. Extremities: no edema Lymphadenopathy: No  cervical adenopathy noted. Neurological: Alert and oriented to person place and time. Skin: Skin is warm and dry. No rashes noted. Psychiatric: Normal mood and affect. Behavior is normal.  ASSESSMENT AND PLAN: 39 year old male with a 2-1/2 month history of lower abdominal pain associated with soft stools, gas, and bloating referred for evaluation. He has been advised to undergo colonoscopy to evaluate for polyps, neoplasia, or inflammatory bowel process.The risks, benefits, and alternatives to colonoscopy with possible biopsy and possible polypectomy were discussed with the patient and they consent to proceed. The procedure will be scheduled with Dr. Leone Payor.  Further recommendations will be made pending the findings of his colonoscopy.    Maikol Grassia, Tollie Pizza PA-C 09/21/2014, 12:16 PM

## 2014-09-21 NOTE — Patient Instructions (Signed)
Your physician has requested that you go to the basement for the following lab work before leaving today: [place] have been scheduled for a colonoscopy. Please follow written instructions given to you at your visit today.  Please pick up your prep kit at the pharmacy within the next 1-3 days. If you use inhalers (even only as needed), please bring them with you on the day of your procedure. Your physician has requested that you go to www.startemmi.com and enter the access code given to you at your visit today. This web site gives a general overview about your procedure. However, you should still follow specific instructions given to you by our office regarding your preparation for the procedure.  CC:Dr [employer]

## 2014-09-24 ENCOUNTER — Ambulatory Visit (AMBULATORY_SURGERY_CENTER): Payer: BLUE CROSS/BLUE SHIELD | Admitting: Internal Medicine

## 2014-09-24 ENCOUNTER — Encounter: Payer: Self-pay | Admitting: Internal Medicine

## 2014-09-24 VITALS — BP 119/79 | HR 73 | Temp 97.7°F | Resp 16 | Ht 71.0 in | Wt 241.0 lb

## 2014-09-24 DIAGNOSIS — R194 Change in bowel habit: Secondary | ICD-10-CM

## 2014-09-24 DIAGNOSIS — R1030 Lower abdominal pain, unspecified: Secondary | ICD-10-CM

## 2014-09-24 DIAGNOSIS — K5730 Diverticulosis of large intestine without perforation or abscess without bleeding: Secondary | ICD-10-CM

## 2014-09-24 MED ORDER — SODIUM CHLORIDE 0.9 % IV SOLN: 500.0000 mL | INTRAVENOUS | Status: DC

## 2014-09-24 NOTE — Op Note (Signed)
[facility] [address] [place], [id number]   COLONOSCOPY PROCEDURE REPORT  PATIENT: Johnathan Parsons, Johnathan E  MR#: [id number] BIRTHDATE: 1976-07-25 , 38  yrs. old GENDER: male ENDOSCOPIST: Iva Booparl E Gessner, MD, [facility] PROCEDURE DATE:  09/24/2014 PROCEDURE:   Colonoscopy, diagnostic First Screening Colonoscopy - Avg.  risk and is 50 yrs.  old or older - No.  Prior Negative Screening - Now for repeat screening. N/A  History of Adenoma - Now for follow-up colonoscopy & has been > or = to 3 yrs.  N/A  Polyps Removed Today? No.  Polyps Removed Today? No.  Recommend repeat exam, <10 yrs? Polyps Removed Today? No.  Recommend repeat exam, <10 yrs? No. ASA CLASS:   Class I INDICATIONS:change in bowel habits and abdominal pain in the lower left quadrant. MEDICATIONS: Propofol 200 mg IV and Monitored anesthesia care  DESCRIPTION OF PROCEDURE:   After the risks benefits and alternatives of the procedure were thoroughly explained, informed consent was obtained.  The digital rectal exam revealed no abnormalities of the rectum, revealed no prostatic nodules, and revealed the prostate was not enlarged.   The LB [id number] endoscope was introduced through the anus and advanced to the cecum, which was identified by both the appendix and ileocecal valve. No adverse events experienced.   The quality of the prep was excellent, using MiraLax  The instrument was then slowly withdrawn as the colon was fully examined.      COLON FINDINGS: There was mild diverticulosis noted in the sigmoid colon.   The examination was otherwise normal.  Retroflexed views revealed no abnormalities. The time to cecum=minutes 59 seconds. Withdrawal time=7 minutes 35 seconds.  The scope was withdrawn and the procedure completed. COMPLICATIONS: There were no immediate complications.  ENDOSCOPIC IMPRESSION: 1.   Mild diverticulosis was noted in the sigmoid colon 2.   The examination was otherwise  normal  RECOMMENDATIONS: Will schedule CT enterography to evaluate jejunal intussusception on CT and LLQ pain, altered bowel habits.  Repat colonoscopy 10 years age 39  eSigned:  Iva Booparl E Gessner, MD, [facility] 09/24/2014 12:21 PM   cc: Babs SciaraScott A Luking, MD and The Patient

## 2014-09-24 NOTE — Progress Notes (Signed)
A/ox3 pleased with MAC, report to Annette RN 

## 2014-09-24 NOTE — Patient Instructions (Addendum)
I found some diverticulosis in the colon - not causing problems in my opinion. I will order a specialized CT scan called CT enterography to see if the small bowel problem seen on CT scan is still there.  I appreciate the opportunity to care for you. Iva Boop, MD, FACG     YOU HAD AN ENDOSCOPIC PROCEDURE TODAY AT THE [facility]: Refer to the procedure report that was given to you for any specific questions about what was found during the examination.  If the procedure report does not answer your questions, please call your gastroenterologist to clarify.  If you requested that your care partner not be given the details of your procedure findings, then the procedure report has been included in a sealed envelope for you to review at your convenience later.  YOU SHOULD EXPECT: Some feelings of bloating in the abdomen. Passage of more gas than usual.  Walking can help get rid of the air that was put into your GI tract during the procedure and reduce the bloating. If you had a lower endoscopy (such as a colonoscopy or flexible sigmoidoscopy) you may notice spotting of blood in your stool or on the toilet paper. If you underwent a bowel prep for your procedure, then you may not have a normal bowel movement for a few days.  DIET: Your first meal following the procedure should be a light meal and then it is ok to progress to your normal diet.  A half-sandwich or bowl of soup is an example of a good first meal.  Heavy or fried foods are harder to digest and may make you feel nauseous or bloated.  Likewise meals heavy in dairy and vegetables can cause extra gas to form and this can also increase the bloating.  Drink plenty of fluids but you should avoid alcoholic beverages for 24 hours.  ACTIVITY: Your care partner should take you home directly after the procedure.  You should plan to take it easy, moving slowly for the rest of the day.  You can resume normal activity the day after the  procedure however you should NOT DRIVE or use heavy machinery for 24 hours (because of the sedation medicines used during the test).    SYMPTOMS TO REPORT IMMEDIATELY: A gastroenterologist can be reached at any hour.  During normal business hours, 8:30 AM to 5:00 PM Monday through Friday, call [phone].  After hours and on weekends, please call the GI answering service at [phone] who will take a message and have the physician on call contact you.   Following lower endoscopy (colonoscopy or flexible sigmoidoscopy):  Excessive amounts of blood in the stool  Significant tenderness or worsening of abdominal pains  Swelling of the abdomen that is new, acute  Fever of 100F or higher FOLLOW UP: If any biopsies were taken you will be contacted by phone or by letter within the next 1-3 weeks.  Call your gastroenterologist if you have not heard about the biopsies in 3 weeks.  Our staff will call the home number listed on your records the next business day following your procedure to check on you and address any questions or concerns that you may have at that time regarding the information given to you following your procedure. This is a courtesy call and so if there is no answer at the home number and we have not heard from you through the emergency physician on call, we will assume that you have returned to your  regular daily activities without incident.  SIGNATURES/CONFIDENTIALITY: You and/or your care partner have signed paperwork which will be entered into your electronic medical record.  These signatures attest to the fact that that the information above on your After Visit Summary has been reviewed and is understood.  Full responsibility of the confidentiality of this discharge information lies with you and/or your care-partner.    Handout was given to your care partner on diverticulosis. You might notice some irritation in your nose or drainage.  This may cause feelings of congestion.   This is from the oxygen, which can be drying.  This is no cause for concern; this should clear up in a few days.  You may resume your current medications today. Dr. Marvell FullerGessner's office nurse will call you with an appointment and instructions for CT. Please call if any questions or concerns.

## 2014-09-25 ENCOUNTER — Telehealth: Payer: Self-pay | Admitting: *Deleted

## 2014-09-25 NOTE — Telephone Encounter (Signed)
Message left on f/u callback 

## 2014-09-29 ENCOUNTER — Telehealth: Payer: Self-pay | Admitting: Internal Medicine

## 2014-09-29 DIAGNOSIS — R1032 Left lower quadrant pain: Secondary | ICD-10-CM

## 2014-09-29 DIAGNOSIS — R194 Change in bowel habit: Secondary | ICD-10-CM

## 2014-09-29 DIAGNOSIS — R933 Abnormal findings on diagnostic imaging of other parts of digestive tract: Secondary | ICD-10-CM

## 2014-09-29 NOTE — Telephone Encounter (Signed)
Patient is scheduled for a CT enterography on Friday 10/02/14 9:30 arrival for a 10:30 appt and he is advised to be NPO for 2 hours prior

## 2014-10-02 ENCOUNTER — Other Ambulatory Visit: Payer: Self-pay

## 2014-10-02 ENCOUNTER — Ambulatory Visit (INDEPENDENT_AMBULATORY_CARE_PROVIDER_SITE_OTHER)
Admission: RE | Admit: 2014-10-02 | Discharge: 2014-10-02 | Disposition: A | Discharge status: Home / Self Care | Payer: BLUE CROSS/BLUE SHIELD | Source: Ambulatory Visit | Attending: Internal Medicine | Admitting: Internal Medicine

## 2014-10-02 DIAGNOSIS — R933 Abnormal findings on diagnostic imaging of other parts of digestive tract: Secondary | ICD-10-CM

## 2014-10-02 MED ORDER — DICYCLOMINE HCL 20 MG PO TABS: 20.0000 mg | ORAL_TABLET | Freq: Four times a day (QID) | ORAL | Status: DC | PRN

## 2014-10-02 MED ORDER — IOHEXOL 300 MG/ML  SOLN
100.0000 mL | Freq: Once | INTRAMUSCULAR | Status: AC | PRN
  Administered 2014-10-02: 100 mL via INTRAVENOUS

## 2014-10-02 NOTE — Progress Notes (Signed)
Quick Note:  No bowel problems here May be spasms of intestine Rx dicyclomine 20 mg q6 prn # 120 no refill If that does not relieve it call back or if other ? ______

## 2015-02-03 ENCOUNTER — Ambulatory Visit (INDEPENDENT_AMBULATORY_CARE_PROVIDER_SITE_OTHER): Payer: BLUE CROSS/BLUE SHIELD | Admitting: Family Medicine

## 2015-02-03 ENCOUNTER — Encounter: Payer: Self-pay | Admitting: Family Medicine

## 2015-02-03 VITALS — BP 132/90 | Temp 98.9°F | Ht 71.0 in | Wt 244.0 lb

## 2015-02-03 DIAGNOSIS — J209 Acute bronchitis, unspecified: Secondary | ICD-10-CM

## 2015-02-03 DIAGNOSIS — J329 Chronic sinusitis, unspecified: Secondary | ICD-10-CM

## 2015-02-03 MED ORDER — CLARITHROMYCIN 500 MG PO TABS: 500.0000 mg | ORAL_TABLET | Freq: Two times a day (BID) | ORAL | Status: AC

## 2015-02-03 NOTE — Progress Notes (Signed)
   Subjective:    Patient ID: Johnathan Bay., male    DOB: 1976-06-23, 39 y.o.   MRN: [id number]  Cough This is a new problem. Episode onset: 5 days. Associated symptoms include headaches, myalgias, nasal congestion and a sore throat. Associated symptoms comments: Chest congestion. Treatments tried: nyquil, dayquil. The treatment provided no relief.    Sat went shopping got achey feltbad  Very tired  Nasal cong and drainage  Chest cong Monday   No energy   Coughing up some gunk  Pods gunkiness  Appetite  Energy puny   No one else sick lately     Review of Systems  HENT: Positive for sore throat.   Respiratory: Positive for cough.   Musculoskeletal: Positive for myalgias.  Neurological: Positive for headaches.       Objective:   Physical Exam  Alert moderate malaise frontal maxillary tenderness evident pharynx erythematous neck supple. Lungs clear. Artery regular in rhythm.      Assessment & Plan:  Impression rhinosinusitis/bronchitis plan Biaxin twice a day 10 days. Symptom care discussed. Warning signs discussed WSL

## 2015-02-03 NOTE — Patient Instructions (Signed)
liely started viral similar to parainfluenza now into secondary sinusitis and bronchitis plz take all the antibiotics

## 2015-02-09 ENCOUNTER — Telehealth: Payer: Self-pay | Admitting: Family Medicine

## 2015-02-09 MED ORDER — BENZONATATE 100 MG PO CAPS: ORAL_CAPSULE | ORAL | Status: DC

## 2015-02-09 NOTE — Telephone Encounter (Signed)
Pt was seen last Wednesday and has since developed a cough and would like for something to be called in for it.   [facility] [place]

## 2015-02-09 NOTE — Telephone Encounter (Signed)
Seen by Dr Brett Canales and given Biaxin- would like a med for cough

## 2015-02-09 NOTE — Telephone Encounter (Signed)
Called patient and informed patient that Tessalon perle 100MG  tablets were sent in to [facility] for cough. Patient verbalized understanding.

## 2015-02-09 NOTE — Telephone Encounter (Signed)
Johnathan Parsons 100 24 one q 6 hrs prn

## 2015-03-02 ENCOUNTER — Ambulatory Visit (INDEPENDENT_AMBULATORY_CARE_PROVIDER_SITE_OTHER): Payer: BLUE CROSS/BLUE SHIELD | Admitting: Family Medicine

## 2015-03-02 ENCOUNTER — Encounter: Payer: Self-pay | Admitting: Family Medicine

## 2015-03-02 VITALS — BP 128/82 | Ht 71.0 in | Wt 245.8 lb

## 2015-03-02 DIAGNOSIS — I10 Essential (primary) hypertension: Secondary | ICD-10-CM

## 2015-03-02 MED ORDER — TERBINAFINE HCL 250 MG PO TABS: ORAL_TABLET | ORAL | Status: DC

## 2015-03-02 MED ORDER — LISINOPRIL 5 MG PO TABS: 5.0000 mg | ORAL_TABLET | Freq: Every day | ORAL | Status: DC

## 2015-03-02 NOTE — Progress Notes (Signed)
   Subjective:    Patient ID: Johnathan BayFloyd E Muralles Jr., male    DOB: [birth year], 39 y.o.   MRN: [id number]  Hypertension This is a chronic problem. The current episode started more than 1 year ago. Pertinent negatives include no chest pain. Risk factors for coronary artery disease include male gender. Treatments tried: lisinopril. There are no compliance problems.    Patient had recent bronchial infection still smokes   Review of Systems  Constitutional: Negative for activity change, appetite change and fatigue.  HENT: Negative for congestion.   Respiratory: Negative for cough.   Cardiovascular: Negative for chest pain.  Gastrointestinal: Negative for abdominal pain.  Endocrine: Negative for polydipsia and polyphagia.  Neurological: Negative for weakness.  Psychiatric/Behavioral: Negative for confusion.       Objective:   Physical Exam  Constitutional: He appears well-nourished. No distress.  Cardiovascular: Normal rate, regular rhythm and normal heart sounds.   No murmur heard. Pulmonary/Chest: Effort normal and breath sounds normal. No respiratory distress.  Musculoskeletal: He exhibits no edema.  Lymphadenopathy:    He has no cervical adenopathy.  Neurological: He is alert.  Psychiatric: His behavior is normal.  Vitals reviewed.         Assessment & Plan:  Patient counseled to quit smoking Blood pressure good control continue current measures Encourage regular physical activity healthy issues trying to reduce weight follow-up in 6 months Lab work at follow-up

## 2015-03-02 NOTE — Patient Instructions (Signed)
DASH Eating Plan °DASH stands for "Dietary Approaches to Stop Hypertension." The DASH eating plan is a healthy eating plan that has been shown to reduce high blood pressure (hypertension). Additional health benefits may include reducing the risk of type 2 diabetes mellitus, heart disease, and stroke. The DASH eating plan may also help with weight loss. °WHAT DO I NEED TO KNOW ABOUT THE DASH EATING PLAN? °For the DASH eating plan, you will follow these general guidelines: °· Choose foods with a percent daily value for sodium of less than 5% (as listed on the food label). °· Use salt-free seasonings or herbs instead of table salt or sea salt. °· Check with your health care provider or pharmacist before using salt substitutes. °· Eat lower-sodium products, often labeled as "lower sodium" or "no salt added." °· Eat fresh foods. °· Eat more vegetables, fruits, and low-fat dairy products. °· Choose whole grains. Look for the word "whole" as the first word in the ingredient list. °· Choose fish and skinless chicken or turkey more often than red meat. Limit fish, poultry, and meat to 6 oz (170 g) each day. °· Limit sweets, desserts, sugars, and sugary drinks. °· Choose heart-healthy fats. °· Limit cheese to 1 oz (28 g) per day. °· Eat more home-cooked food and less restaurant, buffet, and fast food. °· Limit fried foods. °· Cook foods using methods other than frying. °· Limit canned vegetables. If you do use them, rinse them well to decrease the sodium. °· When eating at a restaurant, ask that your food be prepared with less salt, or no salt if possible. °WHAT FOODS CAN I EAT? °Seek help from a dietitian for individual calorie needs. °Grains °Whole grain or whole wheat bread. Brown rice. Whole grain or whole wheat pasta. Quinoa, bulgur, and whole grain cereals. Low-sodium cereals. Corn or whole wheat flour tortillas. Whole grain cornbread. Whole grain crackers. Low-sodium crackers. °Vegetables °Fresh or frozen vegetables  (raw, steamed, roasted, or grilled). Low-sodium or reduced-sodium tomato and vegetable juices. Low-sodium or reduced-sodium tomato sauce and paste. Low-sodium or reduced-sodium canned vegetables.  °Fruits °All fresh, canned (in natural juice), or frozen fruits. °Meat and Other Protein Products °Ground beef (85% or leaner), grass-fed beef, or beef trimmed of fat. Skinless chicken or turkey. Ground chicken or turkey. Pork trimmed of fat. All fish and seafood. Eggs. Dried beans, peas, or lentils. Unsalted nuts and seeds. Unsalted canned beans. °Dairy °Low-fat dairy products, such as skim or 1% milk, 2% or reduced-fat cheeses, low-fat ricotta or cottage cheese, or plain low-fat yogurt. Low-sodium or reduced-sodium cheeses. °Fats and Oils °Tub margarines without trans fats. Light or reduced-fat mayonnaise and salad dressings (reduced sodium). Avocado. Safflower, olive, or canola oils. Natural peanut or almond butter. °Other °Unsalted popcorn and pretzels. °The items listed above may not be a complete list of recommended foods or beverages. Contact your dietitian for more options. °WHAT FOODS ARE NOT RECOMMENDED? °Grains °White bread. White pasta. White rice. Refined cornbread. Bagels and croissants. Crackers that contain trans fat. °Vegetables °Creamed or fried vegetables. Vegetables in a cheese sauce. Regular canned vegetables. Regular canned tomato sauce and paste. Regular tomato and vegetable juices. °Fruits °Dried fruits. Canned fruit in light or heavy syrup. Fruit juice. °Meat and Other Protein Products °Fatty cuts of meat. Ribs, chicken wings, bacon, sausage, bologna, salami, chitterlings, fatback, hot dogs, bratwurst, and packaged luncheon meats. Salted nuts and seeds. Canned beans with salt. °Dairy °Whole or 2% milk, cream, half-and-half, and cream cheese. Whole-fat or sweetened yogurt. Full-fat   cheeses or blue cheese. Nondairy creamers and whipped toppings. Processed cheese, cheese spreads, or cheese  curds. °Condiments °Onion and garlic salt, seasoned salt, table salt, and sea salt. Canned and packaged gravies. Worcestershire sauce. Tartar sauce. Barbecue sauce. Teriyaki sauce. Soy sauce, including reduced sodium. Steak sauce. Fish sauce. Oyster sauce. Cocktail sauce. Horseradish. Ketchup and mustard. Meat flavorings and tenderizers. Bouillon cubes. Hot sauce. Tabasco sauce. Marinades. Taco seasonings. Relishes. °Fats and Oils °Butter, stick margarine, lard, shortening, ghee, and bacon fat. Coconut, palm kernel, or palm oils. Regular salad dressings. °Other °Pickles and olives. Salted popcorn and pretzels. °The items listed above may not be a complete list of foods and beverages to avoid. Contact your dietitian for more information. °WHERE CAN I FIND MORE INFORMATION? °[employer]: www.nhlbi.nih.gov/health/health-topics/topics/dash/ °Document Released: 07/27/2011 Document Revised: 12/22/2013 Document Reviewed: 06/11/2013 °[employer]® Patient Information ©2015 [employer]. This information is not intended to replace advice given to you by your health care provider. Make sure you discuss any questions you have with your health care provider. ° °

## 2015-08-06 ENCOUNTER — Ambulatory Visit (INDEPENDENT_AMBULATORY_CARE_PROVIDER_SITE_OTHER): Payer: BLUE CROSS/BLUE SHIELD | Admitting: Physician Assistant

## 2015-08-06 VITALS — BP 122/88 | HR 101 | Temp 97.7°F | Resp 16 | Ht 70.75 in | Wt 241.0 lb

## 2015-08-06 DIAGNOSIS — J069 Acute upper respiratory infection, unspecified: Secondary | ICD-10-CM

## 2015-08-06 MED ORDER — HYDROCOD POLST-CPM POLST ER 10-8 MG/5ML PO SUER
5.0000 mL | Freq: Two times a day (BID) | ORAL | Status: DC | PRN
Start: 2015-08-06 — End: 2015-10-20

## 2015-08-06 MED ORDER — BENZONATATE 100 MG PO CAPS: 100.0000 mg | ORAL_CAPSULE | Freq: Three times a day (TID) | ORAL | Status: DC | PRN

## 2015-08-06 NOTE — Progress Notes (Signed)
[facility] [address] [place] [address] [phone]  Date:  08/06/2015   Name:  Johnathan Parsons.   DOB:  Sep 24, 1975   MRN:  [id number]  PCP:  Lilyan Punt, MD    Chief Complaint: Nasal Congestion and Cough   History of Present Illness:  This is a 39 y.o. male with PMH HTN and tobacco abuse who is presenting with cough and nasal congestion x 3 days.  Cough: worse in the morning and night. Sleep is disturbed. SOB/wheezing: no Nasal congestion: yes Otalgia: no Sore throat: no Fever/chills: no Aggravating/alleviating factors: delsym, aleve cold and sinus - not getting much relief. History of asthma: no History of env allergies: no Tobacco use: current every day smoker, 1 ppd.  Review of Systems:  Review of Systems See HPI   Patient Active Problem List   Diagnosis Date Noted  . HTN (hypertension) 06/11/2014  . Tobacco use disorder 06/11/2014    Prior to Admission medications   Medication Sig Start Date End Date Taking? Authorizing Provider  lisinopril (PRINIVIL,ZESTRIL) 5 MG tablet Take 1 tablet (5 mg total) by mouth daily. 03/02/15  Yes Babs Sciara, MD    Allergies  Allergen Reactions  . Oxycodone-Acetaminophen Itching  . Sulfa Antibiotics Hives    Past Surgical History  Procedure Laterality Date  . Wisdom tooth extraction    . Vasectomy      Social History  Substance Use Topics  . Smoking status: Current Every Day Smoker -- 1.00 packs/day for 25 years    Types: Cigarettes  . Smokeless tobacco: Never Used  . Alcohol Use: 1.2 oz/week    2 Standard drinks or equivalent per week     Comment: Occassionally    Family History  Problem Relation Age of Onset  . Hypertension Father   . Diabetes Father   . Heart disease Father   . Colon polyps Father   . Liver disease Father   . Hypertension Maternal Grandmother   . Heart disease Maternal Grandmother   . Liver cancer Maternal Grandmother   . Kidney disease Maternal  Grandmother   . Hypertension Maternal Grandfather   . Prostate cancer Maternal Grandfather   . Colon cancer Maternal Grandfather   . Hypertension Paternal Grandmother   . Hypertension Paternal Grandfather   . Pancreatic cancer Paternal Grandfather   . Gallbladder disease Neg Hx   . Esophageal cancer Neg Hx     Medication list has been reviewed and updated.  Physical Examination:  Physical Exam  Constitutional: He is oriented to person, place, and time. He appears well-developed and well-nourished. No distress.  HENT:  Head: Normocephalic and atraumatic.  Right Ear: Hearing, tympanic membrane, external ear and ear canal normal.  Left Ear: Hearing, tympanic membrane, external ear and ear canal normal.  Nose: Nose normal.  Mouth/Throat: Uvula is midline and mucous membranes are normal. Posterior oropharyngeal erythema present. No oropharyngeal exudate or posterior oropharyngeal edema.  Eyes: Conjunctivae and lids are normal. Right eye exhibits no discharge. Left eye exhibits no discharge. No scleral icterus.  Cardiovascular: Normal rate, regular rhythm, normal heart sounds and normal pulses.   No murmur heard. Pulmonary/Chest: Effort normal and breath sounds normal. No respiratory distress. He has no wheezes. He has no rhonchi. He has no rales.  Musculoskeletal: Normal range of motion.  Lymphadenopathy:       Head (right side): No submental, no submandibular and no tonsillar adenopathy present.       Head (  left side): No submental, no submandibular and no tonsillar adenopathy present.    He has no cervical adenopathy.  Neurological: He is alert and oriented to person, place, and time.  Skin: Skin is warm, dry and intact. No lesion and no rash noted.  Psychiatric: He has a normal mood and affect. His speech is normal and behavior is normal. Thought content normal.   BP 122/88 mmHg  Pulse 101  Temp(Src) 97.7 F (36.5 C) (Oral)  Resp 16  Ht 5' 10.75" (1.797 m)  Wt 241 lb (109.317  kg)  BMI 33.85 kg/m2  SpO2 98%  Assessment and Plan:  1. Viral URI Exam benign, vital normal. Likely viral etiology, focus is on supportive care. See meds rx'd below. - chlorpheniramine-HYDROcodone (TUSSIONEX PENNKINETIC ER) 10-8 MG/5ML SUER; Take 5 mLs by mouth every 12 (twelve) hours as needed for cough.  Dispense: 100 mL; Refill: 0 - benzonatate (TESSALON) 100 MG capsule; Take 1-2 capsules (100-200 mg total) by mouth 3 (three) times daily as needed for cough.  Dispense: 40 capsule; Refill: 0   Roswell MinersNicole V. Dyke BrackettBush, PA-C, MHS [facility]  08/06/2015

## 2015-08-06 NOTE — Patient Instructions (Signed)
Drink plenty of water (64 oz/day) and get plenty of rest. If you have been prescribed a cough syrup, do not drive or operate heavy machinery while using this medication. Use tessalon during the day. If your symptoms are not improving in 7 days, return to clinic.

## 2015-09-01 ENCOUNTER — Encounter: Payer: BLUE CROSS/BLUE SHIELD | Admitting: Family Medicine

## 2015-10-20 ENCOUNTER — Ambulatory Visit (INDEPENDENT_AMBULATORY_CARE_PROVIDER_SITE_OTHER): Payer: BLUE CROSS/BLUE SHIELD | Admitting: Family Medicine

## 2015-10-20 ENCOUNTER — Encounter: Payer: Self-pay | Admitting: Family Medicine

## 2015-10-20 VITALS — BP 128/86 | Ht 70.0 in | Wt 245.0 lb

## 2015-10-20 DIAGNOSIS — I10 Essential (primary) hypertension: Secondary | ICD-10-CM | POA: Diagnosis not present

## 2015-10-20 DIAGNOSIS — Z0000 Encounter for general adult medical examination without abnormal findings: Secondary | ICD-10-CM

## 2015-10-20 DIAGNOSIS — E785 Hyperlipidemia, unspecified: Secondary | ICD-10-CM | POA: Diagnosis not present

## 2015-10-20 MED ORDER — LISINOPRIL 5 MG PO TABS: 5.0000 mg | ORAL_TABLET | Freq: Every day | ORAL | Status: DC

## 2015-10-20 NOTE — Patient Instructions (Signed)
DASH Eating Plan DASH stands for "Dietary Approaches to Stop Hypertension." The DASH eating plan is a healthy eating plan that has been shown to reduce high blood pressure (hypertension). Additional health benefits may include reducing the risk of type 2 diabetes mellitus, heart disease, and stroke. The DASH eating plan may also help with weight loss. WHAT DO I NEED TO KNOW ABOUT THE DASH EATING PLAN? For the DASH eating plan, you will follow these general guidelines:  Choose foods with a percent daily value for sodium of less than 5% (as listed on the food label).  Use salt-free seasonings or herbs instead of table salt or sea salt.  Check with your health care provider or pharmacist before using salt substitutes.  Eat lower-sodium products, often labeled as "lower sodium" or "no salt added."  Eat fresh foods.  Eat more vegetables, fruits, and low-fat dairy products.  Choose whole grains. Look for the word "whole" as the first word in the ingredient list.  Choose fish and skinless chicken or turkey more often than red meat. Limit fish, poultry, and meat to 6 oz (170 g) each day.  Limit sweets, desserts, sugars, and sugary drinks.  Choose heart-healthy fats.  Limit cheese to 1 oz (28 g) per day.  Eat more home-cooked food and less restaurant, buffet, and fast food.  Limit fried foods.  Cook foods using methods other than frying.  Limit canned vegetables. If you do use them, rinse them well to decrease the sodium.  When eating at a restaurant, ask that your food be prepared with less salt, or no salt if possible. WHAT FOODS CAN I EAT? Seek help from a dietitian for individual calorie needs. Grains Whole grain or whole wheat bread. Brown rice. Whole grain or whole wheat pasta. Quinoa, bulgur, and whole grain cereals. Low-sodium cereals. Corn or whole wheat flour tortillas. Whole grain cornbread. Whole grain crackers. Low-sodium crackers. Vegetables Fresh or frozen vegetables  (raw, steamed, roasted, or grilled). Low-sodium or reduced-sodium tomato and vegetable juices. Low-sodium or reduced-sodium tomato sauce and paste. Low-sodium or reduced-sodium canned vegetables.  Fruits All fresh, canned (in natural juice), or frozen fruits. Meat and Other Protein Products Ground beef (85% or leaner), grass-fed beef, or beef trimmed of fat. Skinless chicken or turkey. Ground chicken or turkey. Pork trimmed of fat. All fish and seafood. Eggs. Dried beans, peas, or lentils. Unsalted nuts and seeds. Unsalted canned beans. Dairy Low-fat dairy products, such as skim or 1% milk, 2% or reduced-fat cheeses, low-fat ricotta or cottage cheese, or plain low-fat yogurt. Low-sodium or reduced-sodium cheeses. Fats and Oils Tub margarines without trans fats. Light or reduced-fat mayonnaise and salad dressings (reduced sodium). Avocado. Safflower, olive, or canola oils. Natural peanut or almond butter. Other Unsalted popcorn and pretzels. The items listed above may not be a complete list of recommended foods or beverages. Contact your dietitian for more options. WHAT FOODS ARE NOT RECOMMENDED? Grains White bread. White pasta. White rice. Refined cornbread. Bagels and croissants. Crackers that contain trans fat. Vegetables Creamed or fried vegetables. Vegetables in a cheese sauce. Regular canned vegetables. Regular canned tomato sauce and paste. Regular tomato and vegetable juices. Fruits Dried fruits. Canned fruit in light or heavy syrup. Fruit juice. Meat and Other Protein Products Fatty cuts of meat. Ribs, chicken wings, bacon, sausage, bologna, salami, chitterlings, fatback, hot dogs, bratwurst, and packaged luncheon meats. Salted nuts and seeds. Canned beans with salt. Dairy Whole or 2% milk, cream, half-and-half, and cream cheese. Whole-fat or sweetened yogurt. Full-fat   cheeses or blue cheese. Nondairy creamers and whipped toppings. Processed cheese, cheese spreads, or cheese  curds. Condiments Onion and garlic salt, seasoned salt, table salt, and sea salt. Canned and packaged gravies. Worcestershire sauce. Tartar sauce. Barbecue sauce. Teriyaki sauce. Soy sauce, including reduced sodium. Steak sauce. Fish sauce. Oyster sauce. Cocktail sauce. Horseradish. Ketchup and mustard. Meat flavorings and tenderizers. Bouillon cubes. Hot sauce. Tabasco sauce. Marinades. Taco seasonings. Relishes. Fats and Oils Butter, stick margarine, lard, shortening, ghee, and bacon fat. Coconut, palm kernel, or palm oils. Regular salad dressings. Other Pickles and olives. Salted popcorn and pretzels. The items listed above may not be a complete list of foods and beverages to avoid. Contact your dietitian for more information. WHERE CAN I FIND MORE INFORMATION? National Heart, Lung, and Blood Institute: travelstabloid.com   This information is not intended to replace advice given to you by your health care provider. Make sure you discuss any questions you have with your health care provider.   Document Released: 07/27/2011 Document Revised: 08/28/2014 Document Reviewed: 06/11/2013 Elsevier Interactive Patient Education 2016 [employer]. Smoking Cessation, Tips for Success If you are ready to quit smoking, congratulations! You have chosen to help yourself be healthier. Cigarettes bring nicotine, tar, carbon monoxide, and other irritants into your body. Your lungs, heart, and blood vessels will be able to work better without these poisons. There are many different ways to quit smoking. Nicotine gum, nicotine patches, a nicotine inhaler, or nicotine nasal spray can help with physical craving. Hypnosis, support groups, and medicines help break the habit of smoking. WHAT THINGS CAN I DO TO MAKE QUITTING EASIER?  Here are some tips to help you quit for good:  Pick a date when you will quit smoking completely. Tell all of your friends and family about your plan to  quit on that date.  Do not try to slowly cut down on the number of cigarettes you are smoking. Pick a quit date and quit smoking completely starting on that day.  Throw away all cigarettes.   Clean and remove all ashtrays from your home, work, and car.  On a card, write down your reasons for quitting. Carry the card with you and read it when you get the urge to smoke.  Cleanse your body of nicotine. Drink enough water and fluids to keep your urine clear or pale yellow. Do this after quitting to flush the nicotine from your body.  Learn to predict your moods. Do not let a bad situation be your excuse to have a cigarette. Some situations in your life might tempt you into wanting a cigarette.  Never have "just one" cigarette. It leads to wanting another and another. Remind yourself of your decision to quit.  Change habits associated with smoking. If you smoked while driving or when feeling stressed, try other activities to replace smoking. Stand up when drinking your coffee. Brush your teeth after eating. Sit in a different chair when you read the paper. Avoid alcohol while trying to quit, and try to drink fewer caffeinated beverages. Alcohol and caffeine may urge you to smoke.  Avoid foods and drinks that can trigger a desire to smoke, such as sugary or spicy foods and alcohol.  Ask people who smoke not to smoke around you.  Have something planned to do right after eating or having a cup of coffee. For example, plan to take a walk or exercise.  Try a relaxation exercise to calm you down and decrease your stress. Remember, you may be  tense and nervous for the first 2 weeks after you quit, but this will pass.  Find new activities to keep your hands busy. Play with a pen, coin, or rubber band. Doodle or draw things on paper.  Brush your teeth right after eating. This will help cut down on the craving for the taste of tobacco after meals. You can also try mouthwash.   Use oral substitutes  in place of cigarettes. Try using lemon drops, carrots, cinnamon sticks, or chewing gum. Keep them handy so they are available when you have the urge to smoke.  When you have the urge to smoke, try deep breathing.  Designate your home as a nonsmoking area.  If you are a heavy smoker, ask your health care provider about a prescription for nicotine chewing gum. It can ease your withdrawal from nicotine.  Reward yourself. Set aside the cigarette money you save and buy yourself something nice.  Look for support from others. Join a support group or smoking cessation program. Ask someone at home or at work to help you with your plan to quit smoking.  Always ask yourself, "Do I need this cigarette or is this just a reflex?" Tell yourself, "Today, I choose not to smoke," or "I do not want to smoke." You are reminding yourself of your decision to quit.  Do not replace cigarette smoking with electronic cigarettes (commonly called e-cigarettes). The safety of e-cigarettes is unknown, and some may contain harmful chemicals.  If you relapse, do not give up! Plan ahead and think about what you will do the next time you get the urge to smoke. HOW WILL I FEEL WHEN I QUIT SMOKING? You may have symptoms of withdrawal because your body is used to nicotine (the addictive substance in cigarettes). You may crave cigarettes, be irritable, feel very hungry, cough often, get headaches, or have difficulty concentrating. The withdrawal symptoms are only temporary. They are strongest when you first quit but will go away within 10-14 days. When withdrawal symptoms occur, stay in control. Think about your reasons for quitting. Remind yourself that these are signs that your body is healing and getting used to being without cigarettes. Remember that withdrawal symptoms are easier to treat than the major diseases that smoking can cause.  Even after the withdrawal is over, expect periodic urges to smoke. However, these cravings  are generally short lived and will go away whether you smoke or not. Do not smoke! WHAT RESOURCES ARE AVAILABLE TO HELP ME QUIT SMOKING? Your health care provider can direct you to community resources or hospitals for support, which may include:  Group support.  Education.  Hypnosis.  Therapy.   This information is not intended to replace advice given to you by your health care provider. Make sure you discuss any questions you have with your health care provider.   Document Released: 05/05/2004 Document Revised: 08/28/2014 Document Reviewed: 01/23/2013 Elsevier Interactive Patient Education 2016 [employer]. Steps to Quit Smoking  Smoking tobacco can be harmful to your health and can affect almost every organ in your body. Smoking puts you, and those around you, at risk for developing many serious chronic diseases. Quitting smoking is difficult, but it is one of the best things that you can do for your health. It is never too late to quit. WHAT ARE THE BENEFITS OF QUITTING SMOKING? When you quit smoking, you lower your risk of developing serious diseases and conditions, such as:  Lung cancer or lung disease, such as COPD.  Heart disease.  Stroke.  Heart attack.  Infertility.  Osteoporosis and bone fractures. Additionally, symptoms such as coughing, wheezing, and shortness of breath may get better when you quit. You may also find that you get sick less often because your body is stronger at fighting off colds and infections. If you are pregnant, quitting smoking can help to reduce your chances of having a baby of low birth weight. HOW DO I GET READY TO QUIT? When you decide to quit smoking, create a plan to make sure that you are successful. Before you quit:  Pick a date to quit. Set a date within the next two weeks to give you time to prepare.  Write down the reasons why you are quitting. Keep this list in places where you will see it often, such as on your bathroom mirror or  in your car or wallet.  Identify the people, places, things, and activities that make you want to smoke (triggers) and avoid them. Make sure to take these actions:  Throw away all cigarettes at home, at work, and in your car.  Throw away smoking accessories, such as Set designer.  Clean your car and make sure to empty the ashtray.  Clean your home, including curtains and carpets.  Tell your family, friends, and coworkers that you are quitting. Support from your loved ones can make quitting easier.  Talk with your health care provider about your options for quitting smoking.  Find out what treatment options are covered by your health insurance. WHAT STRATEGIES CAN I USE TO QUIT SMOKING?  Talk with your healthcare provider about different strategies to quit smoking. Some strategies include:  Quitting smoking altogether instead of gradually lessening how much you smoke over a period of time. Research shows that quitting "cold [place]" is more successful than gradually quitting.  Attending in-person counseling to help you build problem-solving skills. You are more likely to have success in quitting if you attend several counseling sessions. Even short sessions of 10 minutes can be effective.  Finding resources and support systems that can help you to quit smoking and remain smoke-free after you quit. These resources are most helpful when you use them often. They can include:  Online chats with a Veterinary surgeon.  Telephone quitlines.  Printed Materials engineer.  Support groups or group counseling.  Text messaging programs.  Mobile phone applications.  Taking medicines to help you quit smoking. (If you are pregnant or breastfeeding, talk with your health care provider first.) Some medicines contain nicotine and some do not. Both types of medicines help with cravings, but the medicines that include nicotine help to relieve withdrawal symptoms. Your health care provider may  recommend:  Nicotine patches, gum, or lozenges.  Nicotine inhalers or sprays.  Non-nicotine medicine that is taken by mouth. Talk with your health care provider about combining strategies, such as taking medicines while you are also receiving in-person counseling. Using these two strategies together makes you more likely to succeed in quitting than if you used either strategy on its own. If you are pregnant or breastfeeding, talk with your health care provider about finding counseling or other support strategies to quit smoking. Do not take medicine to help you quit smoking unless told to do so by your health care provider. WHAT THINGS CAN I DO TO MAKE IT EASIER TO QUIT? Quitting smoking might feel overwhelming at first, but there is a lot that you can do to make it easier. Take these important actions:  Reach out  to your family and friends and ask that they support and encourage you during this time. Call telephone quitlines, reach out to support groups, or work with a counselor for support.  Ask people who smoke to avoid smoking around you.  Avoid places that trigger you to smoke, such as bars, parties, or smoke-break areas at work.  Spend time around people who do not smoke.  Lessen stress in your life, because stress can be a smoking trigger for some people. To lessen stress, try:  Exercising regularly.  Deep-breathing exercises.  Yoga.  Meditating.  Performing a body scan. This involves closing your eyes, scanning your body from head to toe, and noticing which parts of your body are particularly tense. Purposefully relax the muscles in those areas.  Download or purchase mobile phone or tablet apps (applications) that can help you stick to your quit plan by providing reminders, tips, and encouragement. There are many free apps, such as QuitGuide from the [employer] developer for Disease Control and Prevention). You can find other support for quitting smoking (smoking cessation) through  smokefree.gov and other websites. HOW WILL I FEEL WHEN I QUIT SMOKING? Within the first 24 hours of quitting smoking, you may start to feel some withdrawal symptoms. These symptoms are usually most noticeable 2-3 days after quitting, but they usually do not last beyond 2-3 weeks. Changes or symptoms that you might experience include:  Mood swings.  Restlessness, anxiety, or irritation.  Difficulty concentrating.  Dizziness.  Strong cravings for sugary foods in addition to nicotine.  Mild weight gain.  Constipation.  Nausea.  Coughing or a sore throat.  Changes in how your medicines work in your body.  A depressed mood.  Difficulty sleeping (insomnia). After the first 2-3 weeks of quitting, you may start to notice more positive results, such as:  Improved sense of smell and taste.  Decreased coughing and sore throat.  Slower heart rate.  Lower blood pressure.  Clearer skin.  The ability to breathe more easily.  Fewer sick days. Quitting smoking is very challenging for most people. Do not get discouraged if you are not successful the first time. Some people need to make many attempts to quit before they achieve long-term success. Do your best to stick to your quit plan, and talk with your health care provider if you have any questions or concerns.   This information is not intended to replace advice given to you by your health care provider. Make sure you discuss any questions you have with your health care provider.   Document Released: 08/01/2001 Document Revised: 12/22/2014 Document Reviewed: 12/22/2014 Elsevier Interactive Patient Education [employer]

## 2015-10-20 NOTE — Progress Notes (Signed)
   Subjective:    Patient ID: Johnathan Bay., male    DOB: 07-24-1976, 40 y.o.   MRN: [id number]  HPI The patient comes in today for a wellness visit.    A review of their health history was completed.  A review of medications was also completed.  Any needed refills; none  Eating habits: not health conscious  Falls/  MVA accidents in past few months: none  Regular exercise: somewhat. Active on weekends. Works office job.  Specialist pt sees on regular basis: none  Preventative health issues were discussed.   Additional concerns: right knee pain. Started about one month ago. No injury.  HTN stable takes medicine watch his diet Smokes he knows he needs to quit just has no desire to quit Denies rectal bleeding chest pain abdominal pain   Review of Systems  Constitutional: Negative for fever, activity change and appetite change.  HENT: Negative for congestion and rhinorrhea.   Eyes: Negative for discharge.  Respiratory: Negative for cough and wheezing.   Cardiovascular: Negative for chest pain.  Gastrointestinal: Negative for vomiting, abdominal pain and blood in stool.  Genitourinary: Negative for frequency and difficulty urinating.  Musculoskeletal: Positive for arthralgias. Negative for neck pain.  Skin: Negative for rash.  Allergic/Immunologic: Negative for environmental allergies and food allergies.  Neurological: Negative for weakness and headaches.  Psychiatric/Behavioral: Negative for agitation.       Objective:   Physical Exam  Constitutional: He appears well-developed and well-nourished.  HENT:  Head: Normocephalic and atraumatic.  Right Ear: External ear normal.  Left Ear: External ear normal.  Nose: Nose normal.  Mouth/Throat: Oropharynx is clear and moist.  Eyes: EOM are normal. Pupils are equal, round, and reactive to light.  Neck: Normal range of motion. Neck supple. No thyromegaly present.  Cardiovascular: Normal rate, regular rhythm and normal  heart sounds.   No murmur heard. Pulmonary/Chest: Effort normal and breath sounds normal. No respiratory distress. He has no wheezes.  Abdominal: Soft. Bowel sounds are normal. He exhibits no distension and no mass. There is no tenderness.  Genitourinary: Penis normal.  Musculoskeletal: Normal range of motion. He exhibits no edema.  Lymphadenopathy:    He has no cervical adenopathy.  Neurological: He is alert. He exhibits normal muscle tone.  Skin: Skin is warm and dry. No erythema.  Psychiatric: He has a normal mood and affect. His behavior is normal. Judgment normal.   right knee ligament stable. No crepitus.        Assessment & Plan:  Knee pain-I feel this is probably just [place] issue but I find no evidence of a tear I don't recommend MRI or orthopedics just yet follow-up if problem  HTN good control continue current medicines refills given follow-up in one year watch diet, check metabolic 7  Quit smoking. Lessen the risk of cancer heart disease stroke. Patient understands this.  Patient to try harder exercising watching diet losing 25-30 pounds over the next year  Wellness safety dietary all discussed in detail.  Dyslipidemia check lipid profile

## 2015-11-06 LAB — BASIC METABOLIC PANEL
BUN / CREAT RATIO: 10 (ref 9–20)
BUN: 10 mg/dL (ref 6–24)
CALCIUM: 9.9 mg/dL (ref 8.7–10.2)
CHLORIDE: 100 mmol/L (ref 96–106)
CO2: 22 mmol/L (ref 18–29)
Creatinine, Ser: 0.98 mg/dL (ref 0.76–1.27)
GFR calc non Af Amer: 95 mL/min/{1.73_m2} (ref >59)
GFR, EST AFRICAN AMERICAN: 109 mL/min/{1.73_m2} (ref >59)
Glucose: 111 mg/dL — ABNORMAL HIGH (ref 65–99)
POTASSIUM: 4.5 mmol/L (ref 3.5–5.2)
SODIUM: 141 mmol/L (ref 134–144)

## 2015-11-06 LAB — LIPID PANEL
CHOL/HDL RATIO: 6.1 ratio — AB (ref 0.0–5.0)
Cholesterol, Total: 189 mg/dL (ref 100–199)
HDL: 31 mg/dL — ABNORMAL LOW (ref >39)
LDL CALC: 126 mg/dL — AB (ref 0–99)
TRIGLYCERIDES: 160 mg/dL — AB (ref 0–149)
VLDL CHOLESTEROL CAL: 32 mg/dL (ref 5–40)

## 2015-11-08 ENCOUNTER — Other Ambulatory Visit: Payer: Self-pay | Admitting: *Deleted

## 2015-11-08 ENCOUNTER — Encounter: Payer: Self-pay | Admitting: Family Medicine

## 2015-11-08 ENCOUNTER — Ambulatory Visit (INDEPENDENT_AMBULATORY_CARE_PROVIDER_SITE_OTHER): Payer: BLUE CROSS/BLUE SHIELD | Admitting: Family Medicine

## 2015-11-08 VITALS — Temp 98.5°F | Ht 70.0 in | Wt 247.0 lb

## 2015-11-08 DIAGNOSIS — J019 Acute sinusitis, unspecified: Secondary | ICD-10-CM | POA: Diagnosis not present

## 2015-11-08 DIAGNOSIS — J209 Acute bronchitis, unspecified: Secondary | ICD-10-CM

## 2015-11-08 DIAGNOSIS — Z79899 Other long term (current) drug therapy: Secondary | ICD-10-CM

## 2015-11-08 DIAGNOSIS — E785 Hyperlipidemia, unspecified: Secondary | ICD-10-CM

## 2015-11-08 DIAGNOSIS — B9689 Other specified bacterial agents as the cause of diseases classified elsewhere: Secondary | ICD-10-CM

## 2015-11-08 DIAGNOSIS — R739 Hyperglycemia, unspecified: Secondary | ICD-10-CM

## 2015-11-08 MED ORDER — AZITHROMYCIN 250 MG PO TABS: ORAL_TABLET | ORAL | Status: DC

## 2015-11-08 MED ORDER — ALBUTEROL SULFATE HFA 108 (90 BASE) MCG/ACT IN AERS: 2.0000 | INHALATION_SPRAY | Freq: Four times a day (QID) | RESPIRATORY_TRACT | Status: DC | PRN

## 2015-11-08 NOTE — Progress Notes (Signed)
   Subjective:    Patient ID: Johnathan BayFloyd E Costilla Jr., male    DOB: 1976-04-24, 40 y.o.   MRN: [id number]  Cough This is a new problem. The current episode started in the past 7 days. Associated symptoms include a fever, headaches, nasal congestion, rhinorrhea and wheezing. Pertinent negatives include no chest pain or ear pain. Treatments tried: dayquil and nyquil.   Patient with significant congestion coughing not feeling good denies high fever chills sweats. States at times feels hot sometimes cold low energy   Review of Systems  Constitutional: Positive for fever. Negative for activity change.  HENT: Positive for congestion and rhinorrhea. Negative for ear pain.   Eyes: Negative for discharge.  Respiratory: Positive for cough and wheezing.   Cardiovascular: Negative for chest pain.  Neurological: Positive for headaches.       Objective:   Physical Exam  Constitutional: He appears well-developed.  HENT:  Head: Normocephalic.  Mouth/Throat: Oropharynx is clear and moist. No oropharyngeal exudate.  Neck: Normal range of motion.  Cardiovascular: Normal rate, regular rhythm and normal heart sounds.   No murmur heard. Pulmonary/Chest: Effort normal and breath sounds normal. He has no wheezes.  Lymphadenopathy:    He has no cervical adenopathy.  Neurological: He exhibits normal muscle tone.  Skin: Skin is warm and dry.  Nursing note and vitals reviewed.         Assessment & Plan:  It is certainly possible this could be significant viral illness with secondary sinusitis antibiotics prescribed inhaler as needed for wheezing if progressive troubles or if worse may need lab work or x-rays but currently does not  Patient was talked to at length about his cholesterol and sugar the importance of healthy eating repeat lab work again in 3-4 months

## 2015-11-22 DIAGNOSIS — M25561 Pain in right knee: Secondary | ICD-10-CM | POA: Diagnosis not present

## 2016-01-03 DIAGNOSIS — M25561 Pain in right knee: Secondary | ICD-10-CM | POA: Diagnosis not present

## 2016-01-24 DIAGNOSIS — R739 Hyperglycemia, unspecified: Secondary | ICD-10-CM | POA: Diagnosis not present

## 2016-01-24 DIAGNOSIS — E785 Hyperlipidemia, unspecified: Secondary | ICD-10-CM | POA: Diagnosis not present

## 2016-01-24 DIAGNOSIS — Z79899 Other long term (current) drug therapy: Secondary | ICD-10-CM | POA: Diagnosis not present

## 2016-01-25 ENCOUNTER — Encounter: Payer: Self-pay | Admitting: Family Medicine

## 2016-01-25 LAB — LIPID PANEL
CHOL/HDL RATIO: 6.9 ratio — AB (ref 0.0–5.0)
Cholesterol, Total: 179 mg/dL (ref 100–199)
HDL: 26 mg/dL — AB (ref >39)
LDL Calculated: 114 mg/dL — ABNORMAL HIGH (ref 0–99)
TRIGLYCERIDES: 195 mg/dL — AB (ref 0–149)
VLDL Cholesterol Cal: 39 mg/dL (ref 5–40)

## 2016-01-25 LAB — HEPATIC FUNCTION PANEL
ALK PHOS: 111 IU/L (ref 39–117)
ALT: 24 IU/L (ref 0–44)
AST: 17 IU/L (ref 0–40)
Albumin: 4.2 g/dL (ref 3.5–5.5)
BILIRUBIN TOTAL: 0.5 mg/dL (ref 0.0–1.2)
BILIRUBIN, DIRECT: 0.14 mg/dL (ref 0.00–0.40)
TOTAL PROTEIN: 6.6 g/dL (ref 6.0–8.5)

## 2016-01-25 LAB — GLUCOSE, RANDOM: Glucose: 100 mg/dL — ABNORMAL HIGH (ref 65–99)

## 2016-01-25 LAB — HEMOGLOBIN A1C
Est. average glucose Bld gHb Est-mCnc: 108 mg/dL
Hgb A1c MFr Bld: 5.4 % (ref 4.8–5.6)

## 2016-02-01 IMAGING — CT CT ABD-PELV W/ CM
2 of 5 series · 15 of 46 positions shown, 17 images · IV contrast (Omni 300)
Comparison: December 14, 2007

CLINICAL DATA: Two-month history of lower abdominal pain radiating
toward scrotum

EXAM:
CT ABDOMEN AND PELVIS WITH CONTRAST
TECHNIQUE: Multidetector CT imaging of the abdomen and pelvis was performed
using the standard protocol following bolus administration of
intravenous contrast. Oral contrast was also administered.
CONTRAST:  1mL OMNIPAQUE IOHEXOL 300 MG/ML  SOLN

[Series 2: abd/ pelvis 5.0 i30f 1 · axial · 0.91mm/px · z∈[+959,+1464]mm · 12 of 113 slices shown, 14 images]
[im 6/113  soft-tissue]
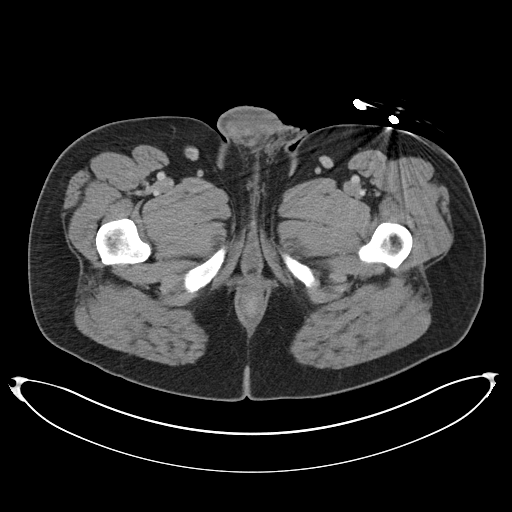
[im 6/113  bone]
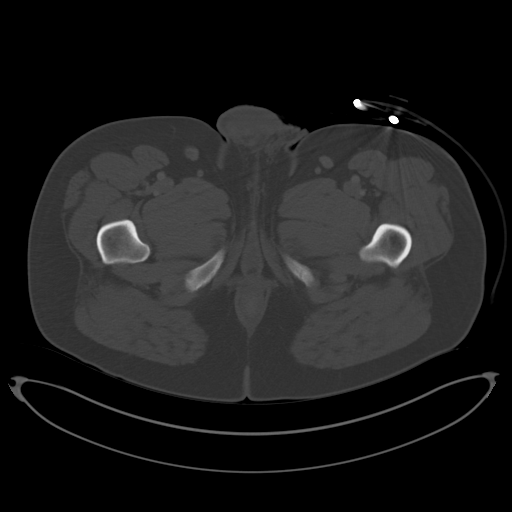
[im 18/113  soft-tissue]
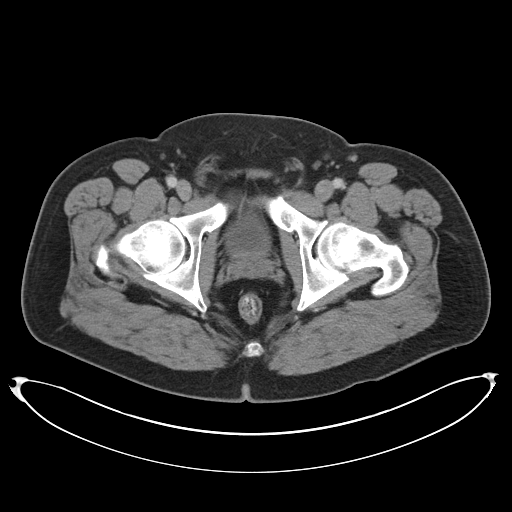
[im 24/113  soft-tissue]
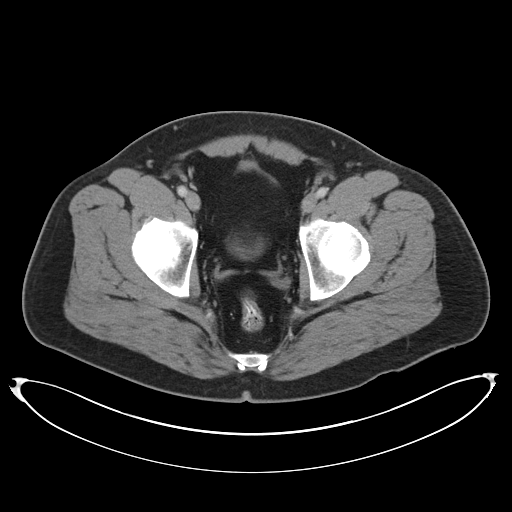
[im 36/113  soft-tissue]
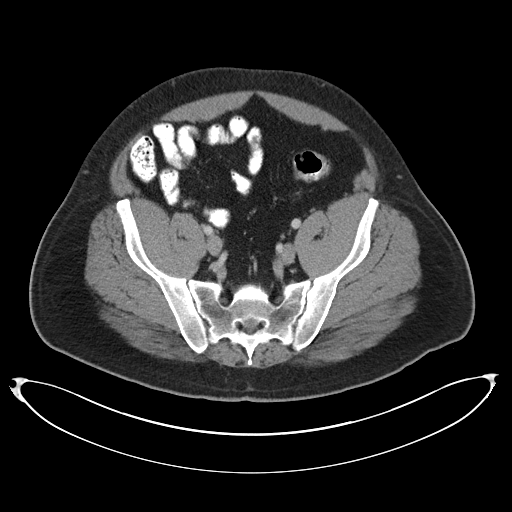
[im 42/113  soft-tissue]
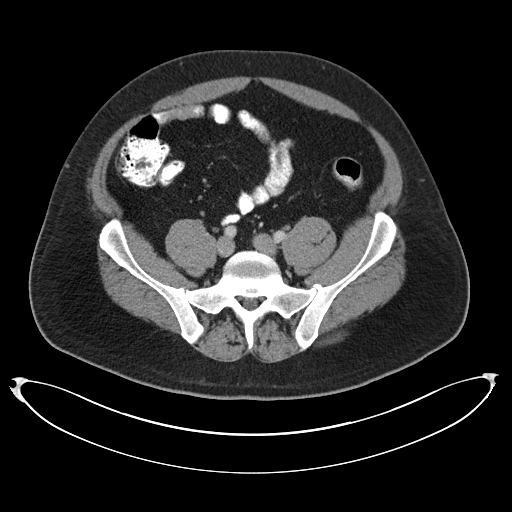
[im 54/113  soft-tissue]
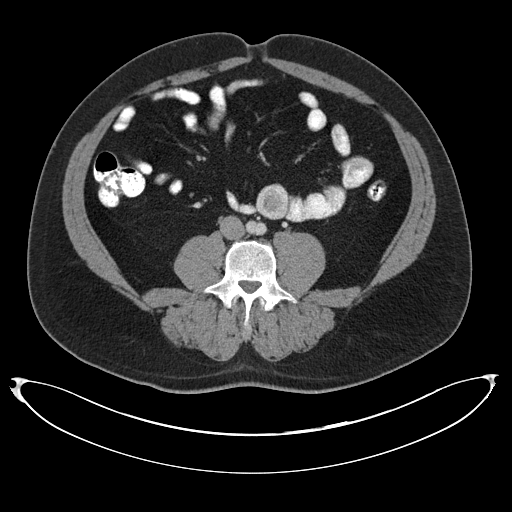
[im 59/113  soft-tissue]
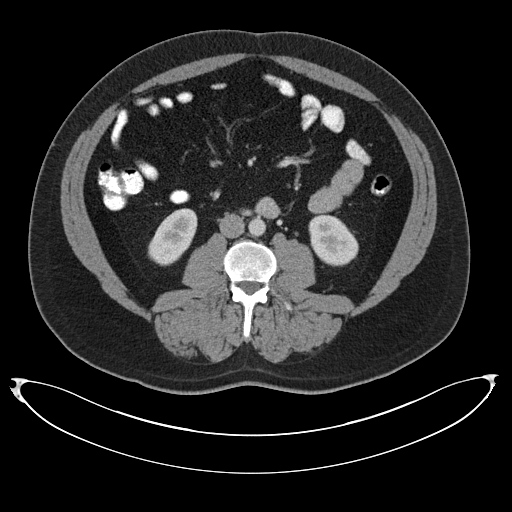
[im 71/113  soft-tissue]
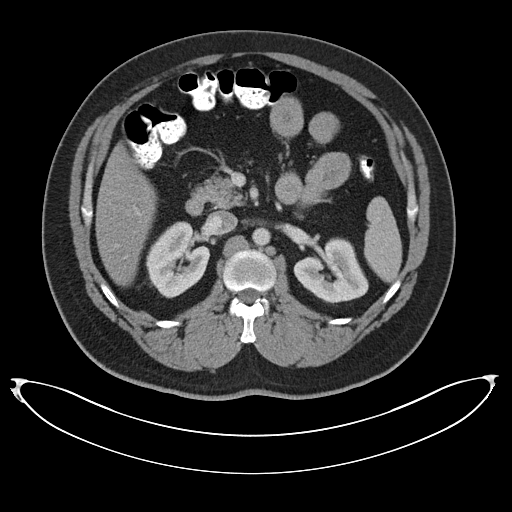
[im 77/113  soft-tissue]
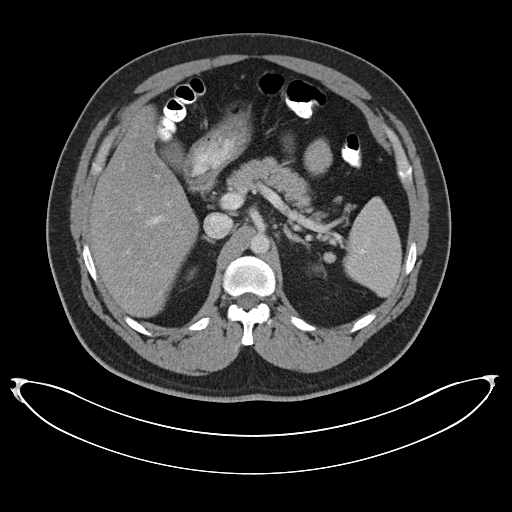
[im 77/113  bone]
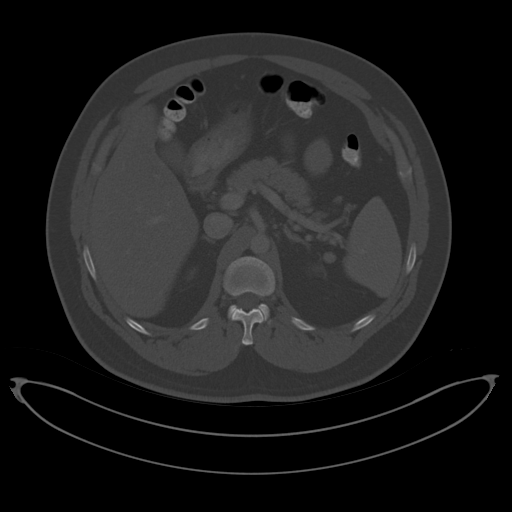
[im 89/113  soft-tissue]
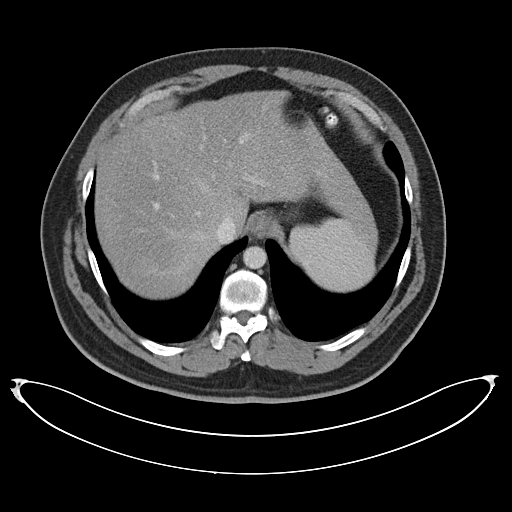
[im 95/113  soft-tissue]
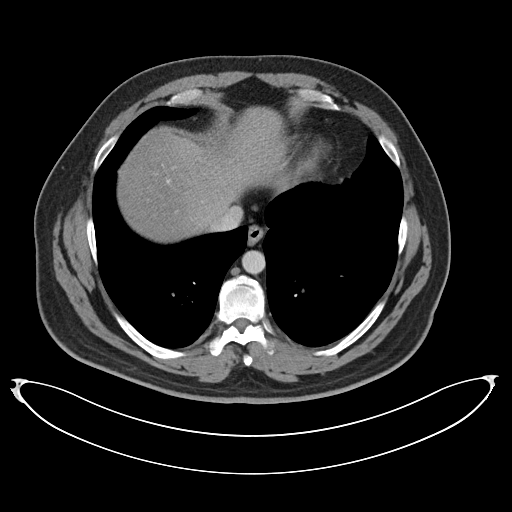
[im 107/113  soft-tissue]
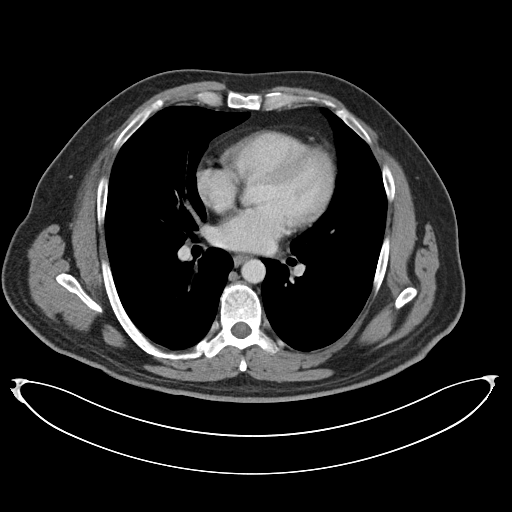

[Series 5: coronals · coronal · 0.81mm/px · 3 of 181 slices shown]
[im 61/181  soft-tissue]
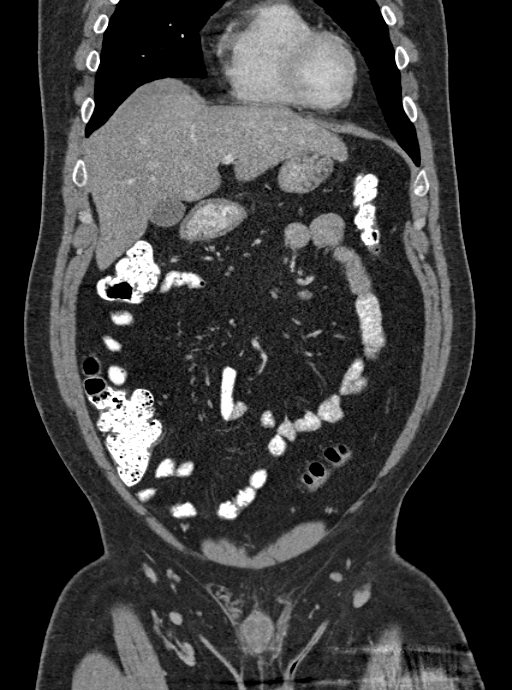
[im 81/181  soft-tissue]
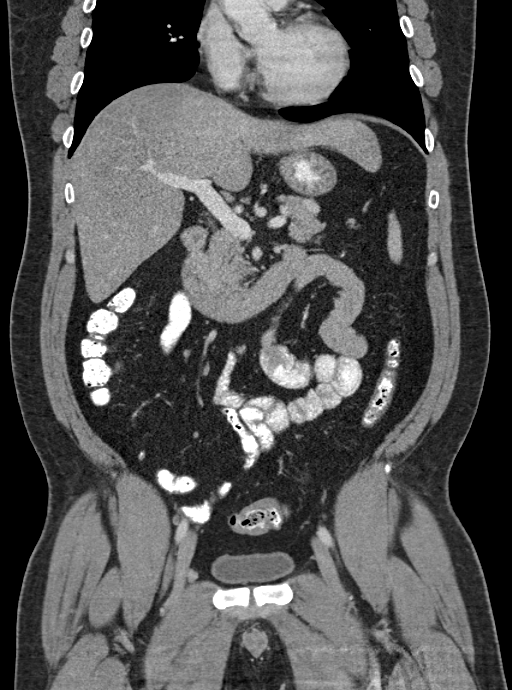
[im 101/181  soft-tissue]
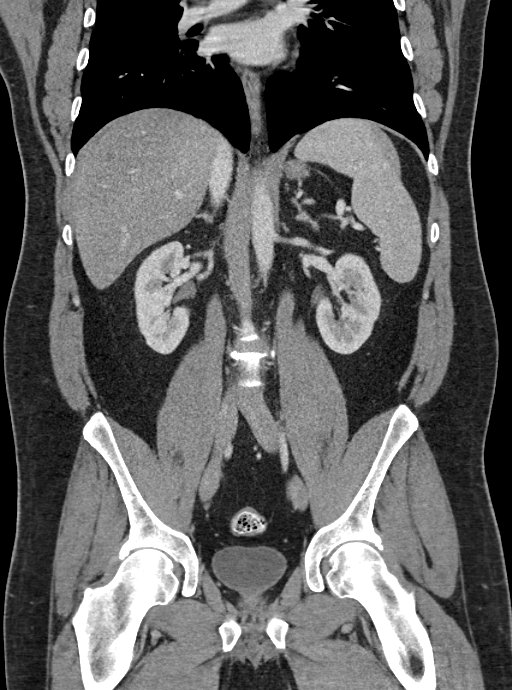

[15 of 46 positions shown; findings below may reference images not displayed]

FINDINGS: There is a tiny calcified granuloma in the posterior left base. Lung
bases are otherwise clear.

Liver is prominent, measuring 19.1 cm in length. There is hepatic
steatosis. No focal liver lesions are identified. Gallbladder wall
is not thickened. There is no appreciable biliary duct dilatation.

Spleen, pancreas, and adrenals appear normal. A small splenule is
noted slightly posterior to the pancreatic tail. Kidneys bilaterally
show no mass or hydronephrosis on either side. There is no renal or
ureteral calculus on either side.

In the pelvis, the urinary bladder is midline with normal wall
thickness. There is no pelvic mass or fluid collection. Appendix
appears normal. No lesions are identified in either inguinal region.

There is no bowel obstruction.  No free air or portal venous air.

There is a minimal ventral hernia containing only fat.

There is a focal area of intussusception in the proximal jejunum,
best seen on axial slices 58 through 61, series 2; coronal slice is
81 through 89 series 5; sagittal slice is 105 through 113 series 6.
Contrast flows freely through this area, and bowel is not
appreciably dilated in this area. The significance of this finding
is questionable.

There is no ascites, adenopathy, or abscess in the abdomen or
pelvis. There is no demonstrable abdominal aortic aneurysm. There
are no blastic or lytic bone lesions.
IMPRESSION: There is an area of intussusception in the proximal jejunum without
obstruction or bowel dilatation focally in this area. The
significance of this finding is uncertain. This finding could be
indicative of a viral type enteritis or may merely represent a
transient phenomenon of doubtful clinical significance. If patient's
symptoms persist, correlation with dedicated CT enterography may be
reasonable given this finding.

Minimal ventral hernia containing only fat.

No bowel obstruction.  No abscess.  Appendix appears normal.

Prominent liver with hepatic steatosis.

## 2016-02-03 ENCOUNTER — Other Ambulatory Visit: Payer: Self-pay

## 2016-02-03 ENCOUNTER — Telehealth: Payer: Self-pay | Admitting: Family Medicine

## 2016-02-03 MED ORDER — LISINOPRIL 5 MG PO TABS: 5.0000 mg | ORAL_TABLET | Freq: Every day | ORAL | Status: DC

## 2016-02-03 NOTE — Telephone Encounter (Signed)
lisinopril (PRINIVIL,ZESTRIL) 5 MG tablet   Pt is needing  A refill on this med to [facility]  90 day supply please

## 2016-02-03 NOTE — Telephone Encounter (Signed)
Called and spoke with patient and informed him [facility] [place] [facility]. Patient verbalized understanding.

## 2016-02-28 ENCOUNTER — Ambulatory Visit (INDEPENDENT_AMBULATORY_CARE_PROVIDER_SITE_OTHER): Payer: BLUE CROSS/BLUE SHIELD | Admitting: Family Medicine

## 2016-02-28 ENCOUNTER — Encounter: Payer: Self-pay | Admitting: Family Medicine

## 2016-02-28 VITALS — BP 128/82 | Ht 70.0 in | Wt 241.8 lb

## 2016-02-28 DIAGNOSIS — B353 Tinea pedis: Secondary | ICD-10-CM | POA: Diagnosis not present

## 2016-02-28 DIAGNOSIS — E785 Hyperlipidemia, unspecified: Secondary | ICD-10-CM | POA: Diagnosis not present

## 2016-02-28 DIAGNOSIS — I10 Essential (primary) hypertension: Secondary | ICD-10-CM | POA: Diagnosis not present

## 2016-02-28 MED ORDER — TERBINAFINE HCL 250 MG PO TABS: 250.0000 mg | ORAL_TABLET | Freq: Every day | ORAL | Status: DC

## 2016-02-28 NOTE — Progress Notes (Signed)
   Subjective:    Patient ID: Johnathan BayFloyd E Grose Jr., male    DOB: Sep 03, 1975, 40 y.o.   MRN: [id number]  Hypertension This is a chronic problem. The current episode started more than 1 year ago. Pertinent negatives include no chest pain, headaches or shortness of breath. Risk factors for coronary artery disease include male gender. Treatments tried: lisinopril. There are no compliance problems.    Discuss labs  Lab work was reviewed. Patient still smoking. He is trying to watch diet is trying exercise some denies chest tightness pressure pain Review of Systems  Constitutional: Negative for fever, activity change and fatigue.  Respiratory: Negative for cough and shortness of breath.   Cardiovascular: Negative for chest pain and leg swelling.  Neurological: Negative for headaches.       Objective:   Physical Exam  Constitutional: He appears well-nourished. No distress.  Cardiovascular: Normal rate, regular rhythm and normal heart sounds.   No murmur heard. Pulmonary/Chest: Effort normal and breath sounds normal. No respiratory distress.  Musculoskeletal: He exhibits no edema.  Lymphadenopathy:    He has no cervical adenopathy.  Neurological: He is alert.  Psychiatric: His behavior is normal.  Vitals reviewed.  Tinea pedis of the feet patient requests Lamisil states over-the-counter topicals have not been working. He states he typically will take the medicine for 3-4 weeks. Recent liver functions look good. If having to use a medication longer he will need to have additional liver testing       Assessment & Plan:  HTN good control continue current measures Dyslipidemia I am concerned about his low HDL and mildly elevated LDL patient will work hard on diet I also encouraged him to quit smoking to lower his risk for heart disease recheck lab work in 6 months a need to go on statin numbers don't improve.

## 2016-05-18 ENCOUNTER — Telehealth: Payer: Self-pay | Admitting: Family Medicine

## 2016-05-18 MED ORDER — LISINOPRIL 5 MG PO TABS: 5.0000 mg | ORAL_TABLET | Freq: Every day | ORAL | 0 refills | Status: DC

## 2016-05-18 NOTE — Telephone Encounter (Signed)
Pt is requesting a 90 day supply refill on his lisinopril (PRINIVIL,ZESTRIL) 5 MG tablet   [facility] [place]

## 2016-05-18 NOTE — Telephone Encounter (Signed)
Prescription sent electronically to pharmacy. Patient notified. 

## 2016-08-28 ENCOUNTER — Ambulatory Visit (INDEPENDENT_AMBULATORY_CARE_PROVIDER_SITE_OTHER): Payer: BLUE CROSS/BLUE SHIELD | Admitting: Family Medicine

## 2016-08-28 ENCOUNTER — Encounter: Payer: Self-pay | Admitting: Family Medicine

## 2016-08-28 VITALS — BP 114/82 | Ht 70.0 in | Wt 244.2 lb

## 2016-08-28 DIAGNOSIS — Z79899 Other long term (current) drug therapy: Secondary | ICD-10-CM | POA: Diagnosis not present

## 2016-08-28 DIAGNOSIS — E785 Hyperlipidemia, unspecified: Secondary | ICD-10-CM | POA: Diagnosis not present

## 2016-08-28 MED ORDER — LISINOPRIL 5 MG PO TABS: 5.0000 mg | ORAL_TABLET | Freq: Every day | ORAL | 2 refills | Status: DC

## 2016-08-28 MED ORDER — ALBUTEROL SULFATE HFA 108 (90 BASE) MCG/ACT IN AERS: 2.0000 | INHALATION_SPRAY | Freq: Four times a day (QID) | RESPIRATORY_TRACT | 2 refills | Status: DC | PRN

## 2016-08-28 NOTE — Progress Notes (Signed)
   Subjective:    Patient ID: Johnathan BayFloyd E Udovich Jr., male    DOB: Dec 12, 1975, 41 y.o.   MRN: [id number]  Hypertension  This is a chronic problem. The current episode started more than 1 year ago. The problem has been gradually improving since onset. Pertinent negatives include no chest pain, headaches or shortness of breath. There are no associated agents to hypertension. There are no known risk factors for coronary artery disease. Treatments tried: lisinopril. The current treatment provides moderate improvement. There are no compliance problems.    Patient has no concerns at this time.  Takes his medicine well without difficulty  Review of Systems  Constitutional: Negative for activity change, fatigue and fever.  Respiratory: Negative for cough and shortness of breath.   Cardiovascular: Negative for chest pain and leg swelling.  Neurological: Negative for headaches.       Objective:   Physical Exam  Constitutional: He appears well-nourished. No distress.  Cardiovascular: Normal rate, regular rhythm and normal heart sounds.   No murmur heard. Pulmonary/Chest: Effort normal and breath sounds normal. No respiratory distress.  Musculoskeletal: He exhibits no edema.  Lymphadenopathy:    He has no cervical adenopathy.  Neurological: He is alert.  Psychiatric: His behavior is normal.  Vitals reviewed.         Assessment & Plan:  This patient overall is doing well He was encouraged quit smoking Try to lose weight Continue medication Lab work will be indicated in about 2 months time Follow-up in approximate 6 months Low-salt diet regular physical activity recommended

## 2016-09-04 ENCOUNTER — Ambulatory Visit (INDEPENDENT_AMBULATORY_CARE_PROVIDER_SITE_OTHER): Payer: BLUE CROSS/BLUE SHIELD | Admitting: Family Medicine

## 2016-09-04 ENCOUNTER — Encounter: Payer: Self-pay | Admitting: Family Medicine

## 2016-09-04 VITALS — BP 130/86 | HR 86 | Wt 240.0 lb

## 2016-09-04 DIAGNOSIS — B9689 Other specified bacterial agents as the cause of diseases classified elsewhere: Secondary | ICD-10-CM

## 2016-09-04 DIAGNOSIS — B349 Viral infection, unspecified: Secondary | ICD-10-CM

## 2016-09-04 DIAGNOSIS — J019 Acute sinusitis, unspecified: Secondary | ICD-10-CM | POA: Diagnosis not present

## 2016-09-04 DIAGNOSIS — R6889 Other general symptoms and signs: Secondary | ICD-10-CM

## 2016-09-04 MED ORDER — AZITHROMYCIN 250 MG PO TABS: ORAL_TABLET | ORAL | 0 refills | Status: DC

## 2016-09-04 NOTE — Patient Instructions (Signed)

## 2016-09-04 NOTE — Progress Notes (Signed)
   Subjective:    Patient ID: Johnathan BayFloyd E Inthavong Jr., male    DOB: 1976-01-31, 41 y.o.   MRN: [id number]  HPI  Patient in office today c/o cough, congestion, and vomiting/diarrhea.  He states symptoms all started Friday evening. He states Robitussin Night-Time has helped some.  Pt denies fever. Patient with viral like symptoms from Friday night to currently some vomiting some diarrhea denies high fever chills denies sweats. Relates some body aches. States some slight chills. Head congestion drainage soreness in the throat cough head congestion denies wheezing or vomiting today  Review of Systems See above.    Objective:   Physical Exam  Looks to feel ill but not toxic neck is supple minimal adenopathy eardrums normal throat is normal lungs clear heart regular      Assessment & Plan:  Viral syndrome Supportive measures discuss Flulike illness Warnings discussed Secondary rhinosinusitis antibiotics prescribed follow-up if ongoing troubles

## 2017-01-29 ENCOUNTER — Ambulatory Visit: Payer: BLUE CROSS/BLUE SHIELD | Admitting: Family Medicine

## 2017-01-31 DIAGNOSIS — E785 Hyperlipidemia, unspecified: Secondary | ICD-10-CM | POA: Diagnosis not present

## 2017-01-31 DIAGNOSIS — Z79899 Other long term (current) drug therapy: Secondary | ICD-10-CM | POA: Diagnosis not present

## 2017-02-01 LAB — BASIC METABOLIC PANEL
BUN / CREAT RATIO: 11 (ref 9–20)
BUN: 11 mg/dL (ref 6–24)
CO2: 24 mmol/L (ref 20–29)
CREATININE: 0.99 mg/dL (ref 0.76–1.27)
Calcium: 9.5 mg/dL (ref 8.7–10.2)
Chloride: 102 mmol/L (ref 96–106)
GFR calc Af Amer: 107 mL/min/{1.73_m2} (ref >59)
GFR, EST NON AFRICAN AMERICAN: 93 mL/min/{1.73_m2} (ref >59)
GLUCOSE: 107 mg/dL — AB (ref 65–99)
Potassium: 4.6 mmol/L (ref 3.5–5.2)
SODIUM: 140 mmol/L (ref 134–144)

## 2017-02-01 LAB — LIPID PANEL
CHOLESTEROL TOTAL: 192 mg/dL (ref 100–199)
Chol/HDL Ratio: 6.2 ratio — ABNORMAL HIGH (ref 0.0–5.0)
HDL: 31 mg/dL — AB (ref >39)
LDL Calculated: 128 mg/dL — ABNORMAL HIGH (ref 0–99)
Triglycerides: 164 mg/dL — ABNORMAL HIGH (ref 0–149)
VLDL CHOLESTEROL CAL: 33 mg/dL (ref 5–40)

## 2017-02-12 ENCOUNTER — Encounter: Payer: Self-pay | Admitting: Family Medicine

## 2017-02-12 ENCOUNTER — Ambulatory Visit (INDEPENDENT_AMBULATORY_CARE_PROVIDER_SITE_OTHER): Payer: BLUE CROSS/BLUE SHIELD | Admitting: Family Medicine

## 2017-02-12 VITALS — BP 122/88 | Ht 70.0 in | Wt 246.0 lb

## 2017-02-12 DIAGNOSIS — I10 Essential (primary) hypertension: Secondary | ICD-10-CM

## 2017-02-12 DIAGNOSIS — E785 Hyperlipidemia, unspecified: Secondary | ICD-10-CM

## 2017-02-12 DIAGNOSIS — M25541 Pain in joints of right hand: Secondary | ICD-10-CM

## 2017-02-12 DIAGNOSIS — M25542 Pain in joints of left hand: Secondary | ICD-10-CM | POA: Diagnosis not present

## 2017-02-12 MED ORDER — LISINOPRIL 5 MG PO TABS: 5.0000 mg | ORAL_TABLET | Freq: Every day | ORAL | 2 refills | Status: DC

## 2017-02-12 NOTE — Progress Notes (Signed)
   Subjective:    Patient ID: Johnathan BayFloyd E Harold Jr., male    DOB: 1976/02/16, 41 y.o.   MRN: [id number]  Hypertension  This is a chronic problem. Treatments tried: lisiniopril. There are no compliance problems.   Patient takes his medicine relates trying a healthy diet stays physically active Is trying to quit smoking using vapor Pt states no concerns today.  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  Review of Systems Relates hand arthralgias denies chest pain shortness breath nausea vomiting diarrhea fever chills sweats    Objective:   Physical Exam Lungs clear no crackles heart is regular no murmurs pulse normal he does not have any visible changes to his hands he relates pain between the DIP and PIP joints both hands more so at the end of the day  Pulses are good no swelling in the ankles       Assessment & Plan:  Arthralgias in both hands or than likely this is related to all the work he does. I would recommend that this patient be tested to look for possibility rheumatoid arthritis. May use OTC anti-inflammatories over the next several weeks if that doesn't do well enough call for prescription, may need referral to rheumatology if symptoms worsen  HTN decent control continue current measures follow-up in 6 months  Hyperlipidemia increased risk of heart disease very important for the patient to quit smoking in addition this healthier diet regular physical activity and losing weight.  We did discuss weight reduction there is no suitable medicines the best approach is healthy diet regular physical activity  Ration has quit smoking tobacco but is using vapor eating but is trying to reduce the amount of this

## 2017-02-12 NOTE — Patient Instructions (Signed)
DASH Eating Plan DASH stands for "Dietary Approaches to Stop Hypertension." The DASH eating plan is a healthy eating plan that has been shown to reduce high blood pressure (hypertension). It may also reduce your risk for type 2 diabetes, heart disease, and stroke. The DASH eating plan may also help with weight loss. What are tips for following this plan? General guidelines  Avoid eating more than 2,300 mg (milligrams) of salt (sodium) a day. If you have hypertension, you may need to reduce your sodium intake to 1,500 mg a day.  Limit alcohol intake to no more than 1 drink a day for nonpregnant women and 2 drinks a day for men. One drink equals 12 oz of beer, 5 oz of wine, or 1 oz of hard liquor.  Work with your health care provider to maintain a healthy body weight or to lose weight. Ask what an ideal weight is for you.  Get at least 30 minutes of exercise that causes your heart to beat faster (aerobic exercise) most days of the week. Activities may include walking, swimming, or biking.  Work with your health care provider or diet and nutrition specialist (dietitian) to adjust your eating plan to your individual calorie needs. Reading food labels  Check food labels for the amount of sodium per serving. Choose foods with less than 5 percent of the Daily Value of sodium. Generally, foods with less than 300 mg of sodium per serving fit into this eating plan.  To find whole grains, look for the word "whole" as the first word in the ingredient list. Shopping  Buy products labeled as "low-sodium" or "no salt added."  Buy fresh foods. Avoid canned foods and premade or frozen meals. Cooking  Avoid adding salt when cooking. Use salt-free seasonings or herbs instead of table salt or sea salt. Check with your health care provider or pharmacist before using salt substitutes.  Do not fry foods. Cook foods using healthy methods such as baking, boiling, grilling, and broiling instead.  Cook with  heart-healthy oils, such as olive, canola, soybean, or sunflower oil. Meal planning   Eat a balanced diet that includes: ? 5 or more servings of fruits and vegetables each day. At each meal, try to fill half of your plate with fruits and vegetables. ? Up to 6-8 servings of whole grains each day. ? Less than 6 oz of lean meat, poultry, or fish each day. A 3-oz serving of meat is about the same size as a deck of cards. One egg equals 1 oz. ? 2 servings of low-fat dairy each day. ? A serving of nuts, seeds, or beans 5 times each week. ? Heart-healthy fats. Healthy fats called Omega-3 fatty acids are found in foods such as flaxseeds and coldwater fish, like sardines, salmon, and mackerel.  Limit how much you eat of the following: ? Canned or prepackaged foods. ? Food that is high in trans fat, such as fried foods. ? Food that is high in saturated fat, such as fatty meat. ? Sweets, desserts, sugary drinks, and other foods with added sugar. ? Full-fat dairy products.  Do not salt foods before eating.  Try to eat at least 2 vegetarian meals each week.  Eat more home-cooked food and less restaurant, buffet, and fast food.  When eating at a restaurant, ask that your food be prepared with less salt or no salt, if possible. What foods are recommended? The items listed may not be a complete list. Talk with your dietitian about what   dietary choices are best for you. Grains Whole-grain or whole-wheat bread. Whole-grain or whole-wheat pasta. Brown rice. Oatmeal. Quinoa. Bulgur. Whole-grain and low-sodium cereals. Pita bread. Low-fat, low-sodium crackers. Whole-wheat flour tortillas. Vegetables Fresh or frozen vegetables (raw, steamed, roasted, or grilled). Low-sodium or reduced-sodium tomato and vegetable juice. Low-sodium or reduced-sodium tomato sauce and tomato paste. Low-sodium or reduced-sodium canned vegetables. Fruits All fresh, dried, or frozen fruit. Canned fruit in natural juice (without  added sugar). Meat and other protein foods Skinless chicken or turkey. Ground chicken or turkey. Pork with fat trimmed off. Fish and seafood. Egg whites. Dried beans, peas, or lentils. Unsalted nuts, nut butters, and seeds. Unsalted canned beans. Lean cuts of beef with fat trimmed off. Low-sodium, lean deli meat. Dairy Low-fat (1%) or fat-free (skim) milk. Fat-free, low-fat, or reduced-fat cheeses. Nonfat, low-sodium ricotta or cottage cheese. Low-fat or nonfat yogurt. Low-fat, low-sodium cheese. Fats and oils Soft margarine without trans fats. Vegetable oil. Low-fat, reduced-fat, or light mayonnaise and salad dressings (reduced-sodium). Canola, safflower, olive, soybean, and sunflower oils. Avocado. Seasoning and other foods Herbs. Spices. Seasoning mixes without salt. Unsalted popcorn and pretzels. Fat-free sweets. What foods are not recommended? The items listed may not be a complete list. Talk with your dietitian about what dietary choices are best for you. Grains Baked goods made with fat, such as croissants, muffins, or some breads. Dry pasta or rice meal packs. Vegetables Creamed or fried vegetables. Vegetables in a cheese sauce. Regular canned vegetables (not low-sodium or reduced-sodium). Regular canned tomato sauce and paste (not low-sodium or reduced-sodium). Regular tomato and vegetable juice (not low-sodium or reduced-sodium). Pickles. Olives. Fruits Canned fruit in a light or heavy syrup. Fried fruit. Fruit in cream or butter sauce. Meat and other protein foods Fatty cuts of meat. Ribs. Fried meat. Bacon. Sausage. Bologna and other processed lunch meats. Salami. Fatback. Hotdogs. Bratwurst. Salted nuts and seeds. Canned beans with added salt. Canned or smoked fish. Whole eggs or egg yolks. Chicken or turkey with skin. Dairy Whole or 2% milk, cream, and half-and-half. Whole or full-fat cream cheese. Whole-fat or sweetened yogurt. Full-fat cheese. Nondairy creamers. Whipped toppings.  Processed cheese and cheese spreads. Fats and oils Butter. Stick margarine. Lard. Shortening. Ghee. Bacon fat. Tropical oils, such as coconut, palm kernel, or palm oil. Seasoning and other foods Salted popcorn and pretzels. Onion salt, garlic salt, seasoned salt, table salt, and sea salt. Worcestershire sauce. Tartar sauce. Barbecue sauce. Teriyaki sauce. Soy sauce, including reduced-sodium. Steak sauce. Canned and packaged gravies. Fish sauce. Oyster sauce. Cocktail sauce. Horseradish that you find on the shelf. Ketchup. Mustard. Meat flavorings and tenderizers. Bouillon cubes. Hot sauce and Tabasco sauce. Premade or packaged marinades. Premade or packaged taco seasonings. Relishes. Regular salad dressings. Where to find more information:  National Heart, Lung, and Blood Institute: www.nhlbi.nih.gov  American Heart Association: www.heart.org Summary  The DASH eating plan is a healthy eating plan that has been shown to reduce high blood pressure (hypertension). It may also reduce your risk for type 2 diabetes, heart disease, and stroke.  With the DASH eating plan, you should limit salt (sodium) intake to 2,300 mg a day. If you have hypertension, you may need to reduce your sodium intake to 1,500 mg a day.  When on the DASH eating plan, aim to eat more fresh fruits and vegetables, whole grains, lean proteins, low-fat dairy, and heart-healthy fats.  Work with your health care provider or diet and nutrition specialist (dietitian) to adjust your eating plan to your individual   calorie needs. This information is not intended to replace advice given to you by your health care provider. Make sure you discuss any questions you have with your health care provider. Document Released: 07/27/2011 Document Revised: 07/31/2016 Document Reviewed: 07/31/2016 Elsevier Interactive Patient Education  2017 [employer]  

## 2017-02-14 LAB — RHEUMATOID FACTOR

## 2017-02-14 LAB — ANA: ANA: NEGATIVE

## 2017-02-14 LAB — SEDIMENTATION RATE: Sed Rate: 3 mm/hr (ref 0–15)

## 2017-02-15 MED ORDER — DICLOFENAC SODIUM 75 MG PO TBEC: 75.0000 mg | DELAYED_RELEASE_TABLET | Freq: Two times a day (BID) | ORAL | 4 refills | Status: DC

## 2017-02-15 NOTE — Addendum Note (Signed)
Addended by: Theodora BlowREWS, Deric Bocock R on: 02/15/2017 01:41 PM   Modules accepted: Orders

## 2017-05-15 DIAGNOSIS — J069 Acute upper respiratory infection, unspecified: Secondary | ICD-10-CM | POA: Diagnosis not present

## 2017-05-16 ENCOUNTER — Encounter: Payer: Self-pay | Admitting: Family Medicine

## 2017-05-16 ENCOUNTER — Ambulatory Visit (INDEPENDENT_AMBULATORY_CARE_PROVIDER_SITE_OTHER): Payer: BLUE CROSS/BLUE SHIELD | Admitting: Family Medicine

## 2017-05-16 VITALS — BP 138/90 | Temp 98.6°F | Ht 70.0 in | Wt 251.0 lb

## 2017-05-16 DIAGNOSIS — J329 Chronic sinusitis, unspecified: Secondary | ICD-10-CM | POA: Diagnosis not present

## 2017-05-16 MED ORDER — CEFTRIAXONE SODIUM 1 G IJ SOLR
500.0000 mg | Freq: Once | INTRAMUSCULAR | Status: AC
  Administered 2017-05-16: 500 mg via INTRAMUSCULAR

## 2017-05-16 MED ORDER — AMOXICILLIN-POT CLAVULANATE 875-125 MG PO TABS: 1.0000 | ORAL_TABLET | Freq: Two times a day (BID) | ORAL | 0 refills | Status: DC

## 2017-05-16 NOTE — Progress Notes (Signed)
   Subjective:    Patient ID: Johnathan Parsons., male    DOB: 02-02-1976, 41 y.o.   MRN: [id number]  Sinusitis  This is a new problem. Episode onset: 3 days. Associated symptoms include congestion, coughing, sinus pressure and sneezing. Treatments tried: aleve cold and sinus. The treatment provided mild relief.   SUN FELT RUN DOWN, NOT A LOT OF SUMPTOMS  MON PRESSURE ROUD THE EYE AND SNEEZING AND D NASAL CONG  SCHEST CONG  LOW GR FEVER  ACHEY   DIM NO ENERGY  A little wheezing not a lot    Review of Systems  HENT: Positive for congestion, sinus pressure and sneezing.   Respiratory: Positive for cough.        Objective:   Physical Exam  Alert, mild malaise. Hydration good Vitals stable. frontal/ maxillary tenderness evident positive nasal congestion. pharynx normal neck supple  lungs clear/no crackles or wheezes. heart regular in rhythm       Assessment & Plan:  Impression rhinosinusitis/bronchitis likely post viral,patient requests injection to start. Use albuterol when necessary. discussed with patient. plan antibiotics prescribed. Questions answered. Symptomatic care discussed. warning signs discussed. WSL

## 2018-02-25 ENCOUNTER — Other Ambulatory Visit: Payer: Self-pay | Admitting: Family Medicine

## 2018-02-25 NOTE — Telephone Encounter (Signed)
May ref ea times one needs chronic o v with dr Lorin Picketscott

## 2018-02-26 ENCOUNTER — Telehealth: Payer: Self-pay | Admitting: Family Medicine

## 2018-02-26 NOTE — Telephone Encounter (Signed)
Last ref was a needs ov, did not do., ca;; pt first and sched o v with dr Lorin Picketscott, if he does this, then ref times one mo

## 2018-02-26 NOTE — Telephone Encounter (Signed)
Fax from pharmacy. Pt requesting refill on Prinivil 5mg .

## 2018-02-26 NOTE — Telephone Encounter (Signed)
Called pt and he did schedule office visit with dr Lorin Picketscott within the next 30 days. One month worth of med sent to pharm.

## 2018-03-11 ENCOUNTER — Ambulatory Visit: Payer: BLUE CROSS/BLUE SHIELD | Admitting: Family Medicine

## 2018-03-11 ENCOUNTER — Encounter: Payer: Self-pay | Admitting: Family Medicine

## 2018-03-11 VITALS — BP 126/84 | Ht 70.0 in | Wt 257.0 lb

## 2018-03-11 DIAGNOSIS — E785 Hyperlipidemia, unspecified: Secondary | ICD-10-CM

## 2018-03-11 DIAGNOSIS — G4733 Obstructive sleep apnea (adult) (pediatric): Secondary | ICD-10-CM

## 2018-03-11 DIAGNOSIS — I10 Essential (primary) hypertension: Secondary | ICD-10-CM | POA: Diagnosis not present

## 2018-03-11 DIAGNOSIS — Z79899 Other long term (current) drug therapy: Secondary | ICD-10-CM | POA: Diagnosis not present

## 2018-03-11 MED ORDER — LISINOPRIL 5 MG PO TABS: 5.0000 mg | ORAL_TABLET | Freq: Every day | ORAL | 1 refills | Status: DC

## 2018-03-11 NOTE — Patient Instructions (Signed)
DASH Eating Plan DASH stands for "Dietary Approaches to Stop Hypertension." The DASH eating plan is a healthy eating plan that has been shown to reduce high blood pressure (hypertension). It may also reduce your risk for type 2 diabetes, heart disease, and stroke. The DASH eating plan may also help with weight loss. What are tips for following this plan? General guidelines  Avoid eating more than 2,300 mg (milligrams) of salt (sodium) a day. If you have hypertension, you may need to reduce your sodium intake to 1,500 mg a day.  Limit alcohol intake to no more than 1 drink a day for nonpregnant women and 2 drinks a day for men. One drink equals 12 oz of beer, 5 oz of wine, or 1 oz of hard liquor.  Work with your health care provider to maintain a healthy body weight or to lose weight. Ask what an ideal weight is for you.  Get at least 30 minutes of exercise that causes your heart to beat faster (aerobic exercise) most days of the week. Activities may include walking, swimming, or biking.  Work with your health care provider or diet and nutrition specialist (dietitian) to adjust your eating plan to your individual calorie needs. Reading food labels  Check food labels for the amount of sodium per serving. Choose foods with less than 5 percent of the Daily Value of sodium. Generally, foods with less than 300 mg of sodium per serving fit into this eating plan.  To find whole grains, look for the word "whole" as the first word in the ingredient list. Shopping  Buy products labeled as "low-sodium" or "no salt added."  Buy fresh foods. Avoid canned foods and premade or frozen meals. Cooking  Avoid adding salt when cooking. Use salt-free seasonings or herbs instead of table salt or sea salt. Check with your health care provider or pharmacist before using salt substitutes.  Do not fry foods. Cook foods using healthy methods such as baking, boiling, grilling, and broiling instead.  Cook with  heart-healthy oils, such as olive, canola, soybean, or sunflower oil. Meal planning   Eat a balanced diet that includes: ? 5 or more servings of fruits and vegetables each day. At each meal, try to fill half of your plate with fruits and vegetables. ? Up to 6-8 servings of whole grains each day. ? Less than 6 oz of lean meat, poultry, or fish each day. A 3-oz serving of meat is about the same size as a deck of cards. One egg equals 1 oz. ? 2 servings of low-fat dairy each day. ? A serving of nuts, seeds, or beans 5 times each week. ? Heart-healthy fats. Healthy fats called Omega-3 fatty acids are found in foods such as flaxseeds and coldwater fish, like sardines, salmon, and mackerel.  Limit how much you eat of the following: ? Canned or prepackaged foods. ? Food that is high in trans fat, such as fried foods. ? Food that is high in saturated fat, such as fatty meat. ? Sweets, desserts, sugary drinks, and other foods with added sugar. ? Full-fat dairy products.  Do not salt foods before eating.  Try to eat at least 2 vegetarian meals each week.  Eat more home-cooked food and less restaurant, buffet, and fast food.  When eating at a restaurant, ask that your food be prepared with less salt or no salt, if possible. What foods are recommended? The items listed may not be a complete list. Talk with your dietitian about what   dietary choices are best for you. Grains Whole-grain or whole-wheat bread. Whole-grain or whole-wheat pasta. Brown rice. Oatmeal. Quinoa. Bulgur. Whole-grain and low-sodium cereals. Pita bread. Low-fat, low-sodium crackers. Whole-wheat flour tortillas. Vegetables Fresh or frozen vegetables (raw, steamed, roasted, or grilled). Low-sodium or reduced-sodium tomato and vegetable juice. Low-sodium or reduced-sodium tomato sauce and tomato paste. Low-sodium or reduced-sodium canned vegetables. Fruits All fresh, dried, or frozen fruit. Canned fruit in natural juice (without  added sugar). Meat and other protein foods Skinless chicken or turkey. Ground chicken or turkey. Pork with fat trimmed off. Fish and seafood. Egg whites. Dried beans, peas, or lentils. Unsalted nuts, nut butters, and seeds. Unsalted canned beans. Lean cuts of beef with fat trimmed off. Low-sodium, lean deli meat. Dairy Low-fat (1%) or fat-free (skim) milk. Fat-free, low-fat, or reduced-fat cheeses. Nonfat, low-sodium ricotta or cottage cheese. Low-fat or nonfat yogurt. Low-fat, low-sodium cheese. Fats and oils Soft margarine without trans fats. Vegetable oil. Low-fat, reduced-fat, or light mayonnaise and salad dressings (reduced-sodium). Canola, safflower, olive, soybean, and sunflower oils. Avocado. Seasoning and other foods Herbs. Spices. Seasoning mixes without salt. Unsalted popcorn and pretzels. Fat-free sweets. What foods are not recommended? The items listed may not be a complete list. Talk with your dietitian about what dietary choices are best for you. Grains Baked goods made with fat, such as croissants, muffins, or some breads. Dry pasta or rice meal packs. Vegetables Creamed or fried vegetables. Vegetables in a cheese sauce. Regular canned vegetables (not low-sodium or reduced-sodium). Regular canned tomato sauce and paste (not low-sodium or reduced-sodium). Regular tomato and vegetable juice (not low-sodium or reduced-sodium). Pickles. Olives. Fruits Canned fruit in a light or heavy syrup. Fried fruit. Fruit in cream or butter sauce. Meat and other protein foods Fatty cuts of meat. Ribs. Fried meat. Bacon. Sausage. Bologna and other processed lunch meats. Salami. Fatback. Hotdogs. Bratwurst. Salted nuts and seeds. Canned beans with added salt. Canned or smoked fish. Whole eggs or egg yolks. Chicken or turkey with skin. Dairy Whole or 2% milk, cream, and half-and-half. Whole or full-fat cream cheese. Whole-fat or sweetened yogurt. Full-fat cheese. Nondairy creamers. Whipped toppings.  Processed cheese and cheese spreads. Fats and oils Butter. Stick margarine. Lard. Shortening. Ghee. Bacon fat. Tropical oils, such as coconut, palm kernel, or palm oil. Seasoning and other foods Salted popcorn and pretzels. Onion salt, garlic salt, seasoned salt, table salt, and sea salt. Worcestershire sauce. Tartar sauce. Barbecue sauce. Teriyaki sauce. Soy sauce, including reduced-sodium. Steak sauce. Canned and packaged gravies. Fish sauce. Oyster sauce. Cocktail sauce. Horseradish that you find on the shelf. Ketchup. Mustard. Meat flavorings and tenderizers. Bouillon cubes. Hot sauce and Tabasco sauce. Premade or packaged marinades. Premade or packaged taco seasonings. Relishes. Regular salad dressings. Where to find more information:  National Heart, Lung, and Blood Institute: www.nhlbi.nih.gov  American Heart Association: www.heart.org Summary  The DASH eating plan is a healthy eating plan that has been shown to reduce high blood pressure (hypertension). It may also reduce your risk for type 2 diabetes, heart disease, and stroke.  With the DASH eating plan, you should limit salt (sodium) intake to 2,300 mg a day. If you have hypertension, you may need to reduce your sodium intake to 1,500 mg a day.  When on the DASH eating plan, aim to eat more fresh fruits and vegetables, whole grains, lean proteins, low-fat dairy, and heart-healthy fats.  Work with your health care provider or diet and nutrition specialist (dietitian) to adjust your eating plan to your individual   calorie needs. This information is not intended to replace advice given to you by your health care provider. Make sure you discuss any questions you have with your health care provider. Document Released: 07/27/2011 Document Revised: 07/31/2016 Document Reviewed: 07/31/2016 Elsevier Interactive Patient Education  2018 Elsevier Inc.  

## 2018-03-11 NOTE — Progress Notes (Signed)
   Subjective:    Patient ID: Johnathan BayFloyd E Tillery Jr., male    DOB: September 29, 1975, 42 y.o.   MRN: [id number]  HPI  Patient is here today to follow up on htn.Patient states he takes Lisinopril 5 mg once daily.  Patient for blood pressure check up.  The patient does have hypertension.  The patient is on medication.  Patient relates compliance with meds. Todays BP reviewed with the patient. Patient denies issues with medication. Patient relates reasonable diet. Patient tries to minimize salt. Patient aware of BP goals.   Patient also has mild obesity issues important for the patient to try to lose weight he has lost few pounds he is trying to exercise some denies any high fever chills sweats  Patient does have significant sleep apnea symptoms has had sleep apnea in the past no longer uses CPAP machine over the past 10 years no other particular troubles Review of Systems  Constitutional: Negative for activity change, fatigue and fever.  HENT: Negative for congestion and rhinorrhea.   Respiratory: Negative for cough and shortness of breath.   Cardiovascular: Negative for chest pain and leg swelling.  Gastrointestinal: Negative for abdominal pain, diarrhea and nausea.  Genitourinary: Negative for dysuria and hematuria.  Neurological: Negative for weakness and headaches.  Psychiatric/Behavioral: Negative for agitation and behavioral problems.       Objective:   Physical Exam  Constitutional: He appears well-nourished. No distress.  HENT:  Head: Normocephalic and atraumatic.  Eyes: Right eye exhibits no discharge. Left eye exhibits no discharge.  Neck: No tracheal deviation present.  Cardiovascular: Normal rate, regular rhythm and normal heart sounds.  No murmur heard. Pulmonary/Chest: Effort normal and breath sounds normal. No respiratory distress.  Musculoskeletal: He exhibits no edema.  Lymphadenopathy:    He has no cervical adenopathy.  Neurological: He is alert. Coordination normal.  Skin:  Skin is warm and dry.  Psychiatric: He has a normal mood and affect. His behavior is normal.  Vitals reviewed.          Assessment & Plan:  Sleep apnea [place] questionnaire Sleep study Probably will need CPAP  HTN- Patient was seen today as part of a visit regarding hypertension. The importance of healthy diet and regular physical activity was discussed. The importance of compliance with medications discussed.  Ideal goal is to keep blood pressure low elevated levels certainly below 140/90 when possible.  The patient was counseled that keeping blood pressure under control lessen his risk of complications.  The importance of regular follow-ups was discussed with the patient.  Low-salt diet such as DASH recommended.  Regular physical activity was recommended as well.  Patient was advised to keep regular follow-ups.  Obesity patient was encouraged to lose weight watch diet stay active

## 2018-03-12 LAB — LIPID PANEL
Chol/HDL Ratio: 6.3 ratio — ABNORMAL HIGH (ref 0.0–5.0)
Cholesterol, Total: 215 mg/dL — ABNORMAL HIGH (ref 100–199)
HDL: 34 mg/dL — AB (ref >39)
LDL Calculated: 140 mg/dL — ABNORMAL HIGH (ref 0–99)
Triglycerides: 206 mg/dL — ABNORMAL HIGH (ref 0–149)
VLDL Cholesterol Cal: 41 mg/dL — ABNORMAL HIGH (ref 5–40)

## 2018-03-12 LAB — BASIC METABOLIC PANEL
BUN / CREAT RATIO: 9 (ref 9–20)
BUN: 9 mg/dL (ref 6–24)
CALCIUM: 9.9 mg/dL (ref 8.7–10.2)
CHLORIDE: 99 mmol/L (ref 96–106)
CO2: 24 mmol/L (ref 20–29)
Creatinine, Ser: 0.96 mg/dL (ref 0.76–1.27)
GFR, EST AFRICAN AMERICAN: 111 mL/min/{1.73_m2} (ref >59)
GFR, EST NON AFRICAN AMERICAN: 96 mL/min/{1.73_m2} (ref >59)
Glucose: 98 mg/dL (ref 65–99)
POTASSIUM: 4.4 mmol/L (ref 3.5–5.2)
SODIUM: 139 mmol/L (ref 134–144)

## 2018-03-12 LAB — HEPATIC FUNCTION PANEL
ALBUMIN: 4.7 g/dL (ref 3.5–5.5)
ALK PHOS: 106 IU/L (ref 39–117)
ALT: 25 IU/L (ref 0–44)
AST: 18 IU/L (ref 0–40)
BILIRUBIN TOTAL: 0.5 mg/dL (ref 0.0–1.2)
BILIRUBIN, DIRECT: 0.13 mg/dL (ref 0.00–0.40)
TOTAL PROTEIN: 7 g/dL (ref 6.0–8.5)

## 2018-03-14 ENCOUNTER — Encounter: Payer: Self-pay | Admitting: Family Medicine

## 2018-03-14 DIAGNOSIS — E785 Hyperlipidemia, unspecified: Secondary | ICD-10-CM | POA: Insufficient documentation

## 2018-03-15 ENCOUNTER — Other Ambulatory Visit: Payer: Self-pay | Admitting: Family Medicine

## 2018-03-15 DIAGNOSIS — Z79899 Other long term (current) drug therapy: Secondary | ICD-10-CM

## 2018-03-15 DIAGNOSIS — E785 Hyperlipidemia, unspecified: Secondary | ICD-10-CM

## 2018-03-15 MED ORDER — ATORVASTATIN CALCIUM 20 MG PO TABS: ORAL_TABLET | ORAL | 5 refills | Status: DC

## 2018-03-15 NOTE — Progress Notes (Unsigned)
at

## 2018-03-20 ENCOUNTER — Telehealth: Payer: Self-pay | Admitting: Family Medicine

## 2018-03-20 NOTE — Telephone Encounter (Signed)
Please sign order for replacement Auto CPAP  In red folder in yellow box  Forward to [facility] to be faxed with all required documentation  [facility] states with original sleep study results & recent OV note they will submit to [facility] & if approved pt will not need new sleep study)

## 2018-03-20 NOTE — Telephone Encounter (Signed)
This was completed thank you 

## 2018-03-20 NOTE — Telephone Encounter (Signed)
Faxed everything to [facility]

## 2018-03-27 DIAGNOSIS — G4733 Obstructive sleep apnea (adult) (pediatric): Secondary | ICD-10-CM | POA: Diagnosis not present

## 2018-04-27 DIAGNOSIS — G4733 Obstructive sleep apnea (adult) (pediatric): Secondary | ICD-10-CM | POA: Diagnosis not present

## 2018-05-27 DIAGNOSIS — G4733 Obstructive sleep apnea (adult) (pediatric): Secondary | ICD-10-CM | POA: Diagnosis not present

## 2018-06-06 DIAGNOSIS — Z79899 Other long term (current) drug therapy: Secondary | ICD-10-CM | POA: Diagnosis not present

## 2018-06-06 DIAGNOSIS — E785 Hyperlipidemia, unspecified: Secondary | ICD-10-CM | POA: Diagnosis not present

## 2018-06-07 LAB — HEPATIC FUNCTION PANEL
ALBUMIN: 4.6 g/dL (ref 3.5–5.5)
ALT: 37 IU/L (ref 0–44)
AST: 22 IU/L (ref 0–40)
Alkaline Phosphatase: 131 IU/L — ABNORMAL HIGH (ref 39–117)
BILIRUBIN TOTAL: 0.6 mg/dL (ref 0.0–1.2)
BILIRUBIN, DIRECT: 0.19 mg/dL (ref 0.00–0.40)
TOTAL PROTEIN: 6.7 g/dL (ref 6.0–8.5)

## 2018-06-07 LAB — LIPID PANEL
Chol/HDL Ratio: 3.8 ratio (ref 0.0–5.0)
Cholesterol, Total: 123 mg/dL (ref 100–199)
HDL: 32 mg/dL — ABNORMAL LOW (ref >39)
LDL CALC: 76 mg/dL (ref 0–99)
Triglycerides: 77 mg/dL (ref 0–149)
VLDL CHOLESTEROL CAL: 15 mg/dL (ref 5–40)

## 2018-06-11 ENCOUNTER — Ambulatory Visit: Payer: BLUE CROSS/BLUE SHIELD | Admitting: Family Medicine

## 2018-06-11 ENCOUNTER — Encounter: Payer: Self-pay | Admitting: Family Medicine

## 2018-06-11 VITALS — BP 120/84 | Ht 70.0 in | Wt 247.2 lb

## 2018-06-11 DIAGNOSIS — I10 Essential (primary) hypertension: Secondary | ICD-10-CM

## 2018-06-11 DIAGNOSIS — B356 Tinea cruris: Secondary | ICD-10-CM | POA: Diagnosis not present

## 2018-06-11 DIAGNOSIS — E785 Hyperlipidemia, unspecified: Secondary | ICD-10-CM | POA: Diagnosis not present

## 2018-06-11 MED ORDER — LISINOPRIL 5 MG PO TABS: 5.0000 mg | ORAL_TABLET | Freq: Every day | ORAL | 1 refills | Status: DC

## 2018-06-11 MED ORDER — BUPROPION HCL ER (SR) 150 MG PO TB12: 150.0000 mg | ORAL_TABLET | Freq: Two times a day (BID) | ORAL | 3 refills | Status: DC

## 2018-06-11 MED ORDER — FLUCONAZOLE 200 MG PO TABS: 200.0000 mg | ORAL_TABLET | Freq: Every day | ORAL | 0 refills | Status: DC

## 2018-06-11 MED ORDER — ATORVASTATIN CALCIUM 20 MG PO TABS: ORAL_TABLET | ORAL | 5 refills | Status: DC

## 2018-06-11 NOTE — Patient Instructions (Signed)
Results for orders placed or performed in visit on 03/15/18  Hepatic function panel  Result Value Ref Range   Total Protein 6.7 6.0 - 8.5 g/dL   Albumin 4.6 3.5 - 5.5 g/dL   Bilirubin Total 0.6 0.0 - 1.2 mg/dL   Bilirubin, Direct 4.09 0.00 - 0.40 mg/dL   Alkaline Phosphatase 131 (H) 39 - 117 IU/L   AST 22 0 - 40 IU/L   ALT 37 0 - 44 IU/L  Lipid Profile  Result Value Ref Range   Cholesterol, Total 123 100 - 199 mg/dL   Triglycerides 77 0 - 149 mg/dL   HDL 32 (L) >81 mg/dL   VLDL Cholesterol Cal 15 5 - 40 mg/dL   LDL Calculated 76 0 - 99 mg/dL   Chol/HDL Ratio 3.8 0.0 - 5.0 ratio    Steps to Quit Smoking Smoking tobacco can be bad for your health. It can also affect almost every organ in your body. Smoking puts you and people around you at risk for many serious long-lasting (chronic) diseases. Quitting smoking is hard, but it is one of the best things that you can do for your health. It is never too late to quit. What are the benefits of quitting smoking? When you quit smoking, you lower your risk for getting serious diseases and conditions. They can include:  Lung cancer or lung disease.  Heart disease.  Stroke.  Heart attack.  Not being able to have children (infertility).  Weak bones (osteoporosis) and broken bones (fractures).  If you have coughing, wheezing, and shortness of breath, those symptoms may get better when you quit. You may also get sick less often. If you are pregnant, quitting smoking can help to lower your chances of having a baby of low birth weight. What can I do to help me quit smoking? Talk with your doctor about what can help you quit smoking. Some things you can do (strategies) include:  Quitting smoking totally, instead of slowly cutting back how much you smoke over a period of time.  Going to in-person counseling. You are more likely to quit if you go to many counseling sessions.  Using resources and support systems, such as: ? Agricultural engineer with  a Veterinary surgeon. ? Phone quitlines. ? Automotive engineer. ? Support groups or group counseling. ? Text messaging programs. ? Mobile phone apps or applications.  Taking medicines. Some of these medicines may have nicotine in them. If you are pregnant or breastfeeding, do not take any medicines to quit smoking unless your doctor says it is okay. Talk with your doctor about counseling or other things that can help you.  Talk with your doctor about using more than one strategy at the same time, such as taking medicines while you are also going to in-person counseling. This can help make quitting easier. What things can I do to make it easier to quit? Quitting smoking might feel very hard at first, but there is a lot that you can do to make it easier. Take these steps:  Talk to your family and friends. Ask them to support and encourage you.  Call phone quitlines, reach out to support groups, or work with a Veterinary surgeon.  Ask people who smoke to not smoke around you.  Avoid places that make you want (trigger) to smoke, such as: ? Bars. ? Parties. ? Smoke-break areas at work.  Spend time with people who do not smoke.  Lower the stress in your life. Stress can make you want  to smoke. Try these things to help your stress: ? Getting regular exercise. ? Deep-breathing exercises. ? Yoga. ? Meditating. ? Doing a body scan. To do this, close your eyes, focus on one area of your body at a time from head to toe, and notice which parts of your body are tense. Try to relax the muscles in those areas.  Download or buy apps on your mobile phone or tablet that can help you stick to your quit plan. There are many free apps, such as QuitGuide from the [employer] developer for Disease Control and Prevention). You can find more support from smokefree.gov and other websites.  This information is not intended to replace advice given to you by your health care provider. Make sure you discuss any questions you have  with your health care provider. Document Released: 06/03/2009 Document Revised: 04/04/2016 Document Reviewed: 12/22/2014 Elsevier Interactive Patient Education  2018 [employer].

## 2018-06-11 NOTE — Progress Notes (Signed)
Subjective:    Patient ID: Johnathan Parsons., male    DOB: Mar 28, 1976, 42 y.o.   MRN: [id number]  Hyperlipidemia  This is a chronic problem. The current episode started more than 1 year ago. Pertinent negatives include no chest pain or shortness of breath. Treatments tried: lipitor 20. There are no compliance problems.  Risk factors for coronary artery disease include dyslipidemia and hypertension.   Patient also gets a rash around his groin region from exercise sweating he does try to wear proper clothing for this  Patient is interested in trying to quit smoking he stopped smoking but then he used vaping but now he wants to get away from that he is tried Chantix in the past but it caused him problems he is interested in Wellbutrin we did discuss nicotine patches nicotine gum and Wellbutrin we also discussed ways to help quit smoking  Patient here for follow-up regarding cholesterol.  The patient does have hyperlipidemia.  Patient does try to maintain a reasonable diet.  Patient does take the medication on a regular basis.  Denies missing a dose.  The patient denies any obvious side effects.  Prior blood work results reviewed with the patient.  The patient is aware of his cholesterol goals and the need to keep it under good control to lessen the risk of disease.  No problems or concerns  Review of Systems  Constitutional: Negative for activity change, fatigue and fever.  HENT: Negative for congestion and rhinorrhea.   Respiratory: Negative for cough and shortness of breath.   Cardiovascular: Negative for chest pain and leg swelling.  Gastrointestinal: Negative for abdominal pain, diarrhea and nausea.  Genitourinary: Negative for dysuria and hematuria.  Neurological: Negative for weakness and headaches.  Psychiatric/Behavioral: Negative for agitation and behavioral problems.       Objective:   Physical Exam  Constitutional: He appears well-nourished.  Cardiovascular: Normal rate,  regular rhythm and normal heart sounds.  No murmur heard. Pulmonary/Chest: Effort normal and breath sounds normal.  Musculoskeletal: He exhibits no edema.  Lymphadenopathy:    He has no cervical adenopathy.  Neurological: He is alert.  Psychiatric: His behavior is normal.  Vitals reviewed.         Assessment & Plan:  HTN- Patient was seen today as part of a visit regarding hypertension. The importance of healthy diet and regular physical activity was discussed. The importance of compliance with medications discussed.  Ideal goal is to keep blood pressure low elevated levels certainly below 140/90 when possible.  The patient was counseled that keeping blood pressure under control lessen his risk of complications.  The importance of regular follow-ups was discussed with the patient.  Low-salt diet such as DASH recommended.  Regular physical activity was recommended as well.  Patient was advised to keep regular follow-ups.  The patient was seen today as part of an evaluation regarding hyperlipidemia.  Recent lab work has been reviewed with the patient as well as the goals for good cholesterol care.  In addition to this medications have been discussed the importance of compliance with diet and medications discussed as well.  Finally the patient is aware that poor control of cholesterol, noncompliance can dramatically increase the risk of complications. The patient will keep regular office visits and the patient does agreed to periodic lab work.  Groin rash consistent with tinea recommend Diflucan also recommend topicals OTC  Smoking cessation talked at length about the importance of quitting smoking he will try Wellbutrin twice daily  He will utilize that for up to 3 to 4 months If he has any problems or side effects he will notify us and stop medicine  Follow-up 6 months

## 2018-06-27 DIAGNOSIS — G4733 Obstructive sleep apnea (adult) (pediatric): Secondary | ICD-10-CM | POA: Diagnosis not present

## 2018-08-26 ENCOUNTER — Encounter: Payer: Self-pay | Admitting: Family Medicine

## 2018-08-26 ENCOUNTER — Ambulatory Visit: Payer: BLUE CROSS/BLUE SHIELD | Admitting: Family Medicine

## 2018-08-26 VITALS — BP 114/80 | Temp 99.4°F | Ht 70.0 in | Wt 251.2 lb

## 2018-08-26 DIAGNOSIS — J069 Acute upper respiratory infection, unspecified: Secondary | ICD-10-CM | POA: Diagnosis not present

## 2018-08-26 DIAGNOSIS — B9789 Other viral agents as the cause of diseases classified elsewhere: Secondary | ICD-10-CM | POA: Diagnosis not present

## 2018-08-26 NOTE — Progress Notes (Signed)
   Subjective:    Patient ID: Johnathan Bay., male    DOB: 1976-06-09, 43 y.o.   MRN: [id number]  Cough  This is a new problem. Episode onset: 2 days. Associated symptoms include a sore throat. Pertinent negatives include no chills, ear pain, headaches or shortness of breath. Associated symptoms comments: Runny nose, coughing, burning in chest. Treatments tried: robitussin.   2 days ago started with not feeling well, rhinorrhea, fatigue, occasional cough productive of dark green mucous, throat is tender. Reports sinus pressure. Reports possible low-grade fever.  Denies body aches, chills, h/a.    Review of Systems  Constitutional: Negative for chills.  HENT: Positive for sinus pressure and sore throat. Negative for ear pain and sinus pain.   Eyes: Negative for discharge.  Respiratory: Positive for cough. Negative for shortness of breath.   Gastrointestinal: Negative for diarrhea, nausea and vomiting.  Neurological: Negative for headaches.       Objective:   Physical Exam Vitals signs and nursing note reviewed.  Constitutional:      General: He is not in acute distress.    Appearance: Normal appearance. He is not toxic-appearing.  HENT:     Head: Normocephalic and atraumatic.     Right Ear: Tympanic membrane normal.     Left Ear: Tympanic membrane normal.     Nose: Nose normal.     Mouth/Throat:     Mouth: Mucous membranes are moist.     Pharynx: Posterior oropharyngeal erythema present.  Eyes:     General:        Right eye: No discharge.        Left eye: No discharge.  Neck:     Musculoskeletal: Neck supple. Muscular tenderness present. No neck rigidity.  Cardiovascular:     Rate and Rhythm: Normal rate and regular rhythm.     Heart sounds: Normal heart sounds.  Pulmonary:     Effort: Pulmonary effort is normal. No respiratory distress.     Breath sounds: Normal breath sounds.  Lymphadenopathy:     Cervical: No cervical adenopathy.  Skin:    General: Skin is warm  and dry.  Neurological:     Mental Status: He is alert and oriented to person, place, and time.  Psychiatric:        Mood and Affect: Mood normal.           Assessment & Plan:  Viral URI with cough  Discussed likely viral etiology at this time, abx not indicated. Symptomatic care discussed, may continue with robitussin use. Warning signs discussed. F/u if symptoms worsen or fail to improve.

## 2018-08-26 NOTE — Patient Instructions (Signed)
Viral Respiratory Infection  A viral respiratory infection is an illness that affects parts of the body that are used for breathing. These include the lungs, nose, and throat. It is caused by a germ called a virus.  Some examples of this kind of infection are:  · A cold.  · The flu (influenza).  · A respiratory syncytial virus (RSV) infection.  A person who gets this illness may have the following symptoms:  · A stuffy or runny nose.  · Yellow or green fluid in the nose.  · A cough.  · Sneezing.  · Tiredness (fatigue).  · Achy muscles.  · A sore throat.  · Sweating or chills.  · A fever.  · A headache.  Follow these instructions at home:  Managing pain and congestion  · Take over-the-counter and prescription medicines only as told by your doctor.  · If you have a sore throat, gargle with salt water. Do this 3-4 times per day or as needed. To make a salt-water mixture, dissolve ½-1 tsp of salt in 1 cup of warm water. Make sure that all the salt dissolves.  · Use nose drops made from salt water. This helps with stuffiness (congestion). It also helps soften the skin around your nose.  · Drink enough fluid to keep your pee (urine) pale yellow.  General instructions    · Rest as much as possible.  · Do not drink alcohol.  · Do not use any products that have nicotine or tobacco, such as cigarettes and e-cigarettes. If you need help quitting, ask your doctor.  · Keep all follow-up visits as told by your doctor. This is important.  How is this prevented?    · Get a flu shot every year. Ask your doctor when you should get your flu shot.  · Do not let other people get your germs. If you are sick:  ? Stay home from work or school.  ? Wash your hands with soap and water often. Wash your hands after you cough or sneeze. If soap and water are not available, use hand sanitizer.  · Avoid contact with people who are sick during cold and flu season. This is in fall and winter.  Get help if:  · Your symptoms last for 10 days or  longer.  · Your symptoms get worse over time.  · You have a fever.  · You have very bad pain in your face or forehead.  · Parts of your jaw or neck become very swollen.  Get help right away if:  · You feel pain or pressure in your chest.  · You have shortness of breath.  · You faint or feel like you will faint.  · You keep throwing up (vomiting).  · You feel confused.  Summary  · A viral respiratory infection is an illness that affects parts of the body that are used for breathing.  · Examples of this illness include a cold, the flu, and respiratory syncytial virus (RSV) infection.  · The infection can cause a runny nose, cough, sneezing, sore throat, and fever.  · Follow what your doctor tells you about taking medicines, drinking lots of fluid, washing your hands, resting at home, and avoiding people who are sick.  This information is not intended to replace advice given to you by your health care provider. Make sure you discuss any questions you have with your health care provider.  Document Released: 07/20/2008 Document Revised: 09/17/2017 Document Reviewed: 09/17/2017  Elsevier   Interactive Patient Education © 2019 Elsevier Inc.

## 2018-09-02 ENCOUNTER — Telehealth: Payer: Self-pay | Admitting: Family Medicine

## 2018-09-02 ENCOUNTER — Other Ambulatory Visit: Payer: Self-pay | Admitting: Family Medicine

## 2018-09-02 MED ORDER — ALBUTEROL SULFATE HFA 108 (90 BASE) MCG/ACT IN AERS: INHALATION_SPRAY | RESPIRATORY_TRACT | 2 refills | Status: DC

## 2018-09-02 MED ORDER — AMOXICILLIN 500 MG PO CAPS: ORAL_CAPSULE | ORAL | 0 refills | Status: DC

## 2018-09-02 NOTE — Telephone Encounter (Signed)
I called and left a message to r/c. 

## 2018-09-02 NOTE — Telephone Encounter (Signed)
Contacted patient. Pt states he will try the antibiotic and inhaler. Both medication sent to pharmacy. Pt informed that if symptoms worsen that he would need to come back in to be rechecked. Pt verbalized understanding.

## 2018-09-02 NOTE — Telephone Encounter (Signed)
Pt was seen 08/26/18 by Lillia Abed for URI, pt states at this appt he was informed to call office next week if no better and he could have an antibiotic called in. Pt states he still has a cough, congestion, low grade fever. Advise.   Pharmacy:  [facility] [address] [place] - [address]

## 2018-09-02 NOTE — Telephone Encounter (Signed)
Please let patient know we are happy to recheck him if he wants to come back in. Otherwise antibiotics are appropriate, recommend amoxicillin 500 mg tid x 10 days. Please let pt know we can also send in a rx for albuterol inhaler for the wheezing if he would like this sent in, we can do 2 puffs q4h prn wheezing. If he feels like his symptoms are getting worse then he would need to be seen again. Thank you!

## 2018-09-02 NOTE — Telephone Encounter (Signed)
Pt returned call. Pt states he has been running a low grade fever of 99.1; no shortness of breath but wheezing. Pt states he cant seem to get the congestion up. Please advise. Thank you

## 2018-09-11 ENCOUNTER — Encounter: Payer: Self-pay | Admitting: Family Medicine

## 2018-09-11 ENCOUNTER — Ambulatory Visit (INDEPENDENT_AMBULATORY_CARE_PROVIDER_SITE_OTHER): Payer: BLUE CROSS/BLUE SHIELD | Admitting: Family Medicine

## 2018-09-11 ENCOUNTER — Telehealth: Payer: Self-pay | Admitting: Family Medicine

## 2018-09-11 VITALS — BP 132/90 | Ht 70.0 in | Wt 252.6 lb

## 2018-09-11 DIAGNOSIS — I10 Essential (primary) hypertension: Secondary | ICD-10-CM | POA: Diagnosis not present

## 2018-09-11 DIAGNOSIS — E785 Hyperlipidemia, unspecified: Secondary | ICD-10-CM | POA: Diagnosis not present

## 2018-09-11 DIAGNOSIS — Z114 Encounter for screening for human immunodeficiency virus [HIV]: Secondary | ICD-10-CM

## 2018-09-11 DIAGNOSIS — Z0000 Encounter for general adult medical examination without abnormal findings: Secondary | ICD-10-CM | POA: Diagnosis not present

## 2018-09-11 DIAGNOSIS — M7541 Impingement syndrome of right shoulder: Secondary | ICD-10-CM

## 2018-09-11 DIAGNOSIS — Z79899 Other long term (current) drug therapy: Secondary | ICD-10-CM

## 2018-09-11 MED ORDER — ATORVASTATIN CALCIUM 20 MG PO TABS: ORAL_TABLET | ORAL | 1 refills | Status: DC

## 2018-09-11 MED ORDER — LISINOPRIL 5 MG PO TABS: 5.0000 mg | ORAL_TABLET | Freq: Every day | ORAL | 1 refills | Status: DC

## 2018-09-11 MED ORDER — DICLOFENAC SODIUM 75 MG PO TBEC: 75.0000 mg | DELAYED_RELEASE_TABLET | Freq: Two times a day (BID) | ORAL | 1 refills | Status: DC

## 2018-09-11 NOTE — Telephone Encounter (Signed)
Last labs 06/06/18 lipid and liver

## 2018-09-11 NOTE — Patient Instructions (Signed)
Steps to Quit Smoking  Smoking tobacco can be bad for your health. It can also affect almost every organ in your body. Smoking puts you and people around you at risk for many serious long-lasting (chronic) diseases. Quitting smoking is hard, but it is one of the best things that you can do for your health. It is never too late to quit. What are the benefits of quitting smoking? When you quit smoking, you lower your risk for getting serious diseases and conditions. They can include:  Lung cancer or lung disease.  Heart disease.  Stroke.  Heart attack.  Not being able to have children (infertility).  Weak bones (osteoporosis) and broken bones (fractures). If you have coughing, wheezing, and shortness of breath, those symptoms may get better when you quit. You may also get sick less often. If you are pregnant, quitting smoking can help to lower your chances of having a baby of low birth weight. What can I do to help me quit smoking? Talk with your doctor about what can help you quit smoking. Some things you can do (strategies) include:  Quitting smoking totally, instead of slowly cutting back how much you smoke over a period of time.  Going to in-person counseling. You are more likely to quit if you go to many counseling sessions.  Using resources and support systems, such as: ? Online chats with a counselor. ? Phone quitlines. ? Printed self-help materials. ? Support groups or group counseling. ? Text messaging programs. ? Mobile phone apps or applications.  Taking medicines. Some of these medicines may have nicotine in them. If you are pregnant or breastfeeding, do not take any medicines to quit smoking unless your doctor says it is okay. Talk with your doctor about counseling or other things that can help you. Talk with your doctor about using more than one strategy at the same time, such as taking medicines while you are also going to in-person counseling. This can help make  quitting easier. What things can I do to make it easier to quit? Quitting smoking might feel very hard at first, but there is a lot that you can do to make it easier. Take these steps:  Talk to your family and friends. Ask them to support and encourage you.  Call phone quitlines, reach out to support groups, or work with a counselor.  Ask people who smoke to not smoke around you.  Avoid places that make you want (trigger) to smoke, such as: ? Bars. ? Parties. ? Smoke-break areas at work.  Spend time with people who do not smoke.  Lower the stress in your life. Stress can make you want to smoke. Try these things to help your stress: ? Getting regular exercise. ? Deep-breathing exercises. ? Yoga. ? Meditating. ? Doing a body scan. To do this, close your eyes, focus on one area of your body at a time from head to toe, and notice which parts of your body are tense. Try to relax the muscles in those areas.  Download or buy apps on your mobile phone or tablet that can help you stick to your quit plan. There are many free apps, such as QuitGuide from the CDC (Centers for Disease Control and Prevention). You can find more support from smokefree.gov and other websites. This information is not intended to replace advice given to you by your health care provider. Make sure you discuss any questions you have with your health care provider. Document Released: 06/03/2009 Document Revised: 04/04/2016   Document Reviewed: 12/22/2014 Elsevier Interactive Patient Education  2019 Elsevier Inc.  DASH Eating Plan DASH stands for "Dietary Approaches to Stop Hypertension." The DASH eating plan is a healthy eating plan that has been shown to reduce high blood pressure (hypertension). It may also reduce your risk for type 2 diabetes, heart disease, and stroke. The DASH eating plan may also help with weight loss. What are tips for following this plan?  General guidelines  Avoid eating more than 2,300 mg  (milligrams) of salt (sodium) a day. If you have hypertension, you may need to reduce your sodium intake to 1,500 mg a day.  Limit alcohol intake to no more than 1 drink a day for nonpregnant women and 2 drinks a day for men. One drink equals 12 oz of beer, 5 oz of wine, or 1 oz of hard liquor.  Work with your health care provider to maintain a healthy body weight or to lose weight. Ask what an ideal weight is for you.  Get at least 30 minutes of exercise that causes your heart to beat faster (aerobic exercise) most days of the week. Activities may include walking, swimming, or biking.  Work with your health care provider or diet and nutrition specialist (dietitian) to adjust your eating plan to your individual calorie needs. Reading food labels   Check food labels for the amount of sodium per serving. Choose foods with less than 5 percent of the Daily Value of sodium. Generally, foods with less than 300 mg of sodium per serving fit into this eating plan.  To find whole grains, look for the word "whole" as the first word in the ingredient list. Shopping  Buy products labeled as "low-sodium" or "no salt added."  Buy fresh foods. Avoid canned foods and premade or frozen meals. Cooking  Avoid adding salt when cooking. Use salt-free seasonings or herbs instead of table salt or sea salt. Check with your health care provider or pharmacist before using salt substitutes.  Do not fry foods. Cook foods using healthy methods such as baking, boiling, grilling, and broiling instead.  Cook with heart-healthy oils, such as olive, canola, soybean, or sunflower oil. Meal planning  Eat a balanced diet that includes: ? 5 or more servings of fruits and vegetables each day. At each meal, try to fill half of your plate with fruits and vegetables. ? Up to 6-8 servings of whole grains each day. ? Less than 6 oz of lean meat, poultry, or fish each day. A 3-oz serving of meat is about the same size as a deck  of cards. One egg equals 1 oz. ? 2 servings of low-fat dairy each day. ? A serving of nuts, seeds, or beans 5 times each week. ? Heart-healthy fats. Healthy fats called Omega-3 fatty acids are found in foods such as flaxseeds and coldwater fish, like sardines, salmon, and mackerel.  Limit how much you eat of the following: ? Canned or prepackaged foods. ? Food that is high in trans fat, such as fried foods. ? Food that is high in saturated fat, such as fatty meat. ? Sweets, desserts, sugary drinks, and other foods with added sugar. ? Full-fat dairy products.  Do not salt foods before eating.  Try to eat at least 2 vegetarian meals each week.  Eat more home-cooked food and less restaurant, buffet, and fast food.  When eating at a restaurant, ask that your food be prepared with less salt or no salt, if possible. What foods are recommended? The   items listed may not be a complete list. Talk with your dietitian about what dietary choices are best for you. Grains Whole-grain or whole-wheat bread. Whole-grain or whole-wheat pasta. Brown rice. Oatmeal. Quinoa. Bulgur. Whole-grain and low-sodium cereals. Pita bread. Low-fat, low-sodium crackers. Whole-wheat flour tortillas. Vegetables Fresh or frozen vegetables (raw, steamed, roasted, or grilled). Low-sodium or reduced-sodium tomato and vegetable juice. Low-sodium or reduced-sodium tomato sauce and tomato paste. Low-sodium or reduced-sodium canned vegetables. Fruits All fresh, dried, or frozen fruit. Canned fruit in natural juice (without added sugar). Meat and other protein foods Skinless chicken or turkey. Ground chicken or turkey. Pork with fat trimmed off. Fish and seafood. Egg whites. Dried beans, peas, or lentils. Unsalted nuts, nut butters, and seeds. Unsalted canned beans. Lean cuts of beef with fat trimmed off. Low-sodium, lean deli meat. Dairy Low-fat (1%) or fat-free (skim) milk. Fat-free, low-fat, or reduced-fat cheeses. Nonfat,  low-sodium ricotta or cottage cheese. Low-fat or nonfat yogurt. Low-fat, low-sodium cheese. Fats and oils Soft margarine without trans fats. Vegetable oil. Low-fat, reduced-fat, or light mayonnaise and salad dressings (reduced-sodium). Canola, safflower, olive, soybean, and sunflower oils. Avocado. Seasoning and other foods Herbs. Spices. Seasoning mixes without salt. Unsalted popcorn and pretzels. Fat-free sweets. What foods are not recommended? The items listed may not be a complete list. Talk with your dietitian about what dietary choices are best for you. Grains Baked goods made with fat, such as croissants, muffins, or some breads. Dry pasta or rice meal packs. Vegetables Creamed or fried vegetables. Vegetables in a cheese sauce. Regular canned vegetables (not low-sodium or reduced-sodium). Regular canned tomato sauce and paste (not low-sodium or reduced-sodium). Regular tomato and vegetable juice (not low-sodium or reduced-sodium). Pickles. Olives. Fruits Canned fruit in a light or heavy syrup. Fried fruit. Fruit in cream or butter sauce. Meat and other protein foods Fatty cuts of meat. Ribs. Fried meat. Bacon. Sausage. Bologna and other processed lunch meats. Salami. Fatback. Hotdogs. Bratwurst. Salted nuts and seeds. Canned beans with added salt. Canned or smoked fish. Whole eggs or egg yolks. Chicken or turkey with skin. Dairy Whole or 2% milk, cream, and half-and-half. Whole or full-fat cream cheese. Whole-fat or sweetened yogurt. Full-fat cheese. Nondairy creamers. Whipped toppings. Processed cheese and cheese spreads. Fats and oils Butter. Stick margarine. Lard. Shortening. Ghee. Bacon fat. Tropical oils, such as coconut, palm kernel, or palm oil. Seasoning and other foods Salted popcorn and pretzels. Onion salt, garlic salt, seasoned salt, table salt, and sea salt. Worcestershire sauce. Tartar sauce. Barbecue sauce. Teriyaki sauce. Soy sauce, including reduced-sodium. Steak sauce.  Canned and packaged gravies. Fish sauce. Oyster sauce. Cocktail sauce. Horseradish that you find on the shelf. Ketchup. Mustard. Meat flavorings and tenderizers. Bouillon cubes. Hot sauce and Tabasco sauce. Premade or packaged marinades. Premade or packaged taco seasonings. Relishes. Regular salad dressings. Where to find more information:  National Heart, Lung, and Blood Institute: www.nhlbi.nih.gov  American Heart Association: www.heart.org Summary  The DASH eating plan is a healthy eating plan that has been shown to reduce high blood pressure (hypertension). It may also reduce your risk for type 2 diabetes, heart disease, and stroke.  With the DASH eating plan, you should limit salt (sodium) intake to 2,300 mg a day. If you have hypertension, you may need to reduce your sodium intake to 1,500 mg a day.  When on the DASH eating plan, aim to eat more fresh fruits and vegetables, whole grains, lean proteins, low-fat dairy, and heart-healthy fats.  Work with your health care provider   or diet and nutrition specialist (dietitian) to adjust your eating plan to your individual calorie needs. This information is not intended to replace advice given to you by your health care provider. Make sure you discuss any questions you have with your health care provider. Document Released: 07/27/2011 Document Revised: 07/31/2016 Document Reviewed: 07/31/2016 Elsevier Interactive Patient Education  2019 Elsevier Inc.  

## 2018-09-11 NOTE — Progress Notes (Signed)
Subjective:    Patient ID: Johnathan BayFloyd E Saraceni Jr., male    DOB: 1976/01/08, 43 y.o.   MRN: [id number]  HPI The patient comes in today for a wellness visit.    A review of their health history was completed.  A review of medications was also completed.  Any needed refills; lisinopril  Eating habits: health conscious  Falls/  MVA accidents in past few months: none  Regular exercise: walk 2-3 times/week for 2-3 miles  Specialist pt sees on regular basis: none  Preventative health issues were discussed.   Additional concerns: pain in right shoulder; develops when he sleeps at night, feels like a pulled muscle. Wears down through the day. Limited ROM . Started about a week ago   Patient for blood pressure check up.  The patient does have hypertension.  The patient is on medication.  Patient relates compliance with meds. Todays BP reviewed with the patient. Patient denies issues with medication. Patient relates reasonable diet. Patient tries to minimize salt. Patient aware of BP goals.  Patient here for follow-up regarding cholesterol.  The patient does have hyperlipidemia.  Patient does try to maintain a reasonable diet.  Patient does take the medication on a regular basis.  Denies missing a dose.  The patient denies any obvious side effects.  Prior blood work results reviewed with the patient.  The patient is aware of his cholesterol goals and the need to keep it under good control to lessen the risk of disease.   Review of Systems  Constitutional: Negative for activity change, appetite change and fever.  HENT: Negative for congestion and rhinorrhea.   Eyes: Negative for discharge.  Respiratory: Negative for cough and wheezing.   Cardiovascular: Negative for chest pain.  Gastrointestinal: Negative for abdominal pain, blood in stool and vomiting.  Genitourinary: Negative for difficulty urinating and frequency.  Musculoskeletal: Positive for arthralgias. Negative for neck pain.    Skin: Negative for rash.  Allergic/Immunologic: Negative for environmental allergies and food allergies.  Neurological: Negative for weakness and headaches.  Psychiatric/Behavioral: Negative for agitation.       Objective:   Physical Exam Constitutional:      Appearance: He is well-developed.  HENT:     Head: Normocephalic and atraumatic.     Right Ear: External ear normal.     Left Ear: External ear normal.     Nose: Nose normal.  Eyes:     Pupils: Pupils are equal, round, and reactive to light.  Neck:     Musculoskeletal: Normal range of motion and neck supple.     Thyroid: No thyromegaly.  Cardiovascular:     Rate and Rhythm: Normal rate and regular rhythm.     Heart sounds: Normal heart sounds. No murmur.  Pulmonary:     Effort: Pulmonary effort is normal. No respiratory distress.     Breath sounds: Normal breath sounds. No wheezing.  Abdominal:     General: Bowel sounds are normal. There is no distension.     Palpations: Abdomen is soft. There is no mass.     Tenderness: There is no abdominal tenderness.  Genitourinary:    Penis: Normal.   Musculoskeletal: Normal range of motion.  Lymphadenopathy:     Cervical: No cervical adenopathy.  Skin:    General: Skin is warm and dry.     Findings: No erythema.  Neurological:     Mental Status: He is alert.     Motor: No abnormal muscle tone.  Psychiatric:  Behavior: Behavior normal.        Judgment: Judgment normal.    Patient does have some mild deltoid impingement when he raises his arm to the right side the rest of his evaluation for rotator cuff was negative  Prostate exam not indicated does not have primary family member of colon cancer or prostate cancer       Assessment & Plan:  Adult wellness-complete.wellness physical was conducted today. Importance of diet and exercise were discussed in detail.  In addition to this a discussion regarding safety was also covered. We also reviewed over immunizations  and gave recommendations regarding current immunization needed for age.  In addition to this additional areas were also touched on including: Preventative health exams needed:  Colonoscopy not indicated till age 64  Patient was advised yearly wellness exam Healthy eating recommended for the patient Try to lose some weight increase physical activity  Patient does use tobacco products.  Patient knows they should quit.  Patient is aware that smoking/in use of tobacco products increases their risk of heart disease, cancer, and COPD- lung issues.  Patient has been counseled to quit smoking/tobacco products.  Right shoulder impingement recommend anti-inflammatory over the course the next few weeks along with range of motion exercises which were shown if not seeing significant improvement with this to notify us and we will refer to orthopedics  HTN- Patient was seen today as part of a visit regarding hypertension. The importance of healthy diet and regular physical activity was discussed. The importance of compliance with medications discussed.  Ideal goal is to keep blood pressure low elevated levels certainly below 140/90 when possible.  The patient was counseled that keeping blood pressure under control lessen his risk of complications.  The importance of regular follow-ups was discussed with the patient.  Low-salt diet such as DASH recommended.  Regular physical activity was recommended as well.  Patient was advised to keep regular follow-ups.  Patient's medications including blood pressure cholesterol refill

## 2018-09-11 NOTE — Telephone Encounter (Signed)
Pt has 6 month follow up 03-17-19 with Dr.Scott, requesting blood work.

## 2018-09-12 ENCOUNTER — Other Ambulatory Visit: Payer: Self-pay | Admitting: *Deleted

## 2018-09-12 ENCOUNTER — Telehealth: Payer: Self-pay

## 2018-09-12 MED ORDER — BUPROPION HCL ER (SR) 150 MG PO TB12: 150.0000 mg | ORAL_TABLET | Freq: Every day | ORAL | 11 refills | Status: DC

## 2018-09-12 NOTE — Telephone Encounter (Signed)
Since he is taking this once daily he can have 30 tablets, 12 refills

## 2018-09-12 NOTE — Telephone Encounter (Signed)
Med sent to pharm. Pt notified.  

## 2018-09-12 NOTE — Telephone Encounter (Signed)
Received a rx request for Bupropion Er/Sr one po Bid for # 60 from [facility] [place]. I did not see this on him medication list.I called and left a message on vm asking to r/c to see if he is taking this.

## 2018-09-12 NOTE — Telephone Encounter (Signed)
Patient states he is taking the bupropion 150 once a day and he does want a refill.

## 2018-09-15 NOTE — Telephone Encounter (Signed)
Lipid, liver, metabolic 7, also CDC recommends HIV antibody testing x1 for all adults

## 2018-09-16 NOTE — Telephone Encounter (Signed)
Orders placed. I called and left a message to r/c. 

## 2018-09-16 NOTE — Telephone Encounter (Signed)
Patient is aware 

## 2018-09-20 ENCOUNTER — Telehealth: Payer: Self-pay | Admitting: Family Medicine

## 2018-09-20 DIAGNOSIS — M7541 Impingement syndrome of right shoulder: Secondary | ICD-10-CM

## 2018-09-20 NOTE — Telephone Encounter (Signed)
Patient was seen on 09/11/18 by Dr.Scott and was advised if patients pain in shoulder was no better to call office today in regards of getting a referral to a specialty office placed, on 0-10 scale patients pain is a 2 with no movement, and radiates with movement shoots up to a 10. (nurse aware of pain documentation.) advise.

## 2018-09-20 NOTE — Telephone Encounter (Signed)
FYI. Please advise.

## 2018-09-20 NOTE — Telephone Encounter (Signed)
Referral ordered in Epic. Patient notified. 

## 2018-09-20 NOTE — Telephone Encounter (Signed)
Ortho referral urgent shoulder pain

## 2018-09-23 ENCOUNTER — Encounter: Payer: Self-pay | Admitting: Family Medicine

## 2018-09-27 DIAGNOSIS — M25511 Pain in right shoulder: Secondary | ICD-10-CM | POA: Diagnosis not present

## 2019-03-17 ENCOUNTER — Ambulatory Visit: Payer: BLUE CROSS/BLUE SHIELD | Admitting: Family Medicine

## 2019-09-15 ENCOUNTER — Other Ambulatory Visit: Payer: Self-pay | Admitting: Family Medicine

## 2019-09-15 MED ORDER — LISINOPRIL 5 MG PO TABS: 5.0000 mg | ORAL_TABLET | Freq: Every day | ORAL | 0 refills | Status: DC

## 2019-09-15 NOTE — Addendum Note (Signed)
Addended by: Margaretha Sheffield on: 09/15/2019 04:40 PM   Modules accepted: Orders

## 2019-09-17 ENCOUNTER — Other Ambulatory Visit: Payer: Self-pay | Admitting: Family Medicine

## 2019-09-24 DIAGNOSIS — L820 Inflamed seborrheic keratosis: Secondary | ICD-10-CM | POA: Diagnosis not present

## 2019-09-24 DIAGNOSIS — L821 Other seborrheic keratosis: Secondary | ICD-10-CM | POA: Diagnosis not present

## 2019-09-24 DIAGNOSIS — D1801 Hemangioma of skin and subcutaneous tissue: Secondary | ICD-10-CM | POA: Diagnosis not present

## 2019-09-24 DIAGNOSIS — D2261 Melanocytic nevi of right upper limb, including shoulder: Secondary | ICD-10-CM | POA: Diagnosis not present

## 2019-09-24 DIAGNOSIS — D2272 Melanocytic nevi of left lower limb, including hip: Secondary | ICD-10-CM | POA: Diagnosis not present

## 2019-12-15 ENCOUNTER — Other Ambulatory Visit: Payer: Self-pay | Admitting: Family Medicine

## 2019-12-16 ENCOUNTER — Other Ambulatory Visit: Payer: Self-pay | Admitting: Family Medicine

## 2020-05-11 DIAGNOSIS — Z23 Encounter for immunization: Secondary | ICD-10-CM | POA: Diagnosis not present

## 2020-05-12 ENCOUNTER — Telehealth: Payer: Self-pay | Admitting: Family Medicine

## 2020-05-12 DIAGNOSIS — Z114 Encounter for screening for human immunodeficiency virus [HIV]: Secondary | ICD-10-CM

## 2020-05-12 DIAGNOSIS — Z1159 Encounter for screening for other viral diseases: Secondary | ICD-10-CM

## 2020-05-12 DIAGNOSIS — I10 Essential (primary) hypertension: Secondary | ICD-10-CM

## 2020-05-12 DIAGNOSIS — E785 Hyperlipidemia, unspecified: Secondary | ICD-10-CM

## 2020-05-12 NOTE — Telephone Encounter (Signed)
Last labs 06/08/18: Liver and lipid

## 2020-05-12 NOTE — Telephone Encounter (Signed)
Phy was scheduled for 11/19 need blood work ordered?

## 2020-05-12 NOTE — Telephone Encounter (Signed)
Lipid liver met 7 hep c ab and hiv ab OVF:IEPPIRJJ,OAC,ZYS screening

## 2020-05-12 NOTE — Telephone Encounter (Signed)
Blood work ordered in Epic. Left message to return call to notify patient. 

## 2020-05-14 NOTE — Telephone Encounter (Signed)
Patient notified

## 2020-06-15 ENCOUNTER — Other Ambulatory Visit: Payer: Self-pay | Admitting: Family Medicine

## 2020-06-28 DIAGNOSIS — E785 Hyperlipidemia, unspecified: Secondary | ICD-10-CM | POA: Diagnosis not present

## 2020-06-28 DIAGNOSIS — I10 Essential (primary) hypertension: Secondary | ICD-10-CM | POA: Diagnosis not present

## 2020-06-28 DIAGNOSIS — Z114 Encounter for screening for human immunodeficiency virus [HIV]: Secondary | ICD-10-CM | POA: Diagnosis not present

## 2020-06-28 DIAGNOSIS — Z1159 Encounter for screening for other viral diseases: Secondary | ICD-10-CM | POA: Diagnosis not present

## 2020-06-29 LAB — LIPID PANEL
Chol/HDL Ratio: 5.9 ratio — ABNORMAL HIGH (ref 0.0–5.0)
Cholesterol, Total: 183 mg/dL (ref 100–199)
HDL: 31 mg/dL — ABNORMAL LOW (ref >39)
LDL Chol Calc (NIH): 133 mg/dL — ABNORMAL HIGH (ref 0–99)
Triglycerides: 105 mg/dL (ref 0–149)
VLDL Cholesterol Cal: 19 mg/dL (ref 5–40)

## 2020-06-29 LAB — HEPATIC FUNCTION PANEL
ALT: 29 IU/L (ref 0–44)
AST: 20 IU/L (ref 0–40)
Albumin: 4.4 g/dL (ref 4.0–5.0)
Alkaline Phosphatase: 102 IU/L (ref 44–121)
Bilirubin Total: 0.8 mg/dL (ref 0.0–1.2)
Bilirubin, Direct: 0.22 mg/dL (ref 0.00–0.40)
Total Protein: 6.6 g/dL (ref 6.0–8.5)

## 2020-06-29 LAB — BASIC METABOLIC PANEL
BUN/Creatinine Ratio: 10 (ref 9–20)
BUN: 10 mg/dL (ref 6–24)
CO2: 23 mmol/L (ref 20–29)
Calcium: 9.4 mg/dL (ref 8.7–10.2)
Chloride: 105 mmol/L (ref 96–106)
Creatinine, Ser: 0.96 mg/dL (ref 0.76–1.27)
GFR calc Af Amer: 109 mL/min/{1.73_m2} (ref >59)
GFR calc non Af Amer: 94 mL/min/{1.73_m2} (ref >59)
Glucose: 128 mg/dL — ABNORMAL HIGH (ref 65–99)
Potassium: 4.5 mmol/L (ref 3.5–5.2)
Sodium: 140 mmol/L (ref 134–144)

## 2020-06-29 LAB — HIV ANTIBODY (ROUTINE TESTING W REFLEX): HIV Screen 4th Generation wRfx: NONREACTIVE

## 2020-06-29 LAB — HEPATITIS C ANTIBODY: Hep C Virus Ab: <0.1 s/co ratio (ref 0.0–0.9)

## 2020-07-09 ENCOUNTER — Encounter: Payer: Self-pay | Admitting: Family Medicine

## 2020-07-09 ENCOUNTER — Ambulatory Visit (INDEPENDENT_AMBULATORY_CARE_PROVIDER_SITE_OTHER): Payer: BC Managed Care – PPO | Admitting: Family Medicine

## 2020-07-09 ENCOUNTER — Other Ambulatory Visit: Payer: Self-pay

## 2020-07-09 VITALS — BP 124/80 | HR 91 | Temp 97.2°F | Ht 71.25 in | Wt 260.0 lb

## 2020-07-09 DIAGNOSIS — M1380 Other specified arthritis, unspecified site: Secondary | ICD-10-CM | POA: Diagnosis not present

## 2020-07-09 DIAGNOSIS — M2550 Pain in unspecified joint: Secondary | ICD-10-CM | POA: Diagnosis not present

## 2020-07-09 DIAGNOSIS — Z0000 Encounter for general adult medical examination without abnormal findings: Secondary | ICD-10-CM | POA: Diagnosis not present

## 2020-07-09 DIAGNOSIS — M25541 Pain in joints of right hand: Secondary | ICD-10-CM

## 2020-07-09 DIAGNOSIS — N529 Male erectile dysfunction, unspecified: Secondary | ICD-10-CM | POA: Diagnosis not present

## 2020-07-09 DIAGNOSIS — R7309 Other abnormal glucose: Secondary | ICD-10-CM | POA: Diagnosis not present

## 2020-07-09 DIAGNOSIS — M25542 Pain in joints of left hand: Secondary | ICD-10-CM

## 2020-07-09 DIAGNOSIS — R7301 Impaired fasting glucose: Secondary | ICD-10-CM

## 2020-07-09 MED ORDER — LISINOPRIL 5 MG PO TABS
5.0000 mg | ORAL_TABLET | Freq: Every day | ORAL | 1 refills | Status: DC
Start: 2020-07-09 — End: 2021-01-07

## 2020-07-09 MED ORDER — BUPROPION HCL ER (SR) 150 MG PO TB12
150.0000 mg | ORAL_TABLET | Freq: Every day | ORAL | 1 refills | Status: DC
Start: 2020-07-09 — End: 2021-02-01

## 2020-07-09 MED ORDER — TADALAFIL 20 MG PO TABS: ORAL_TABLET | ORAL | 4 refills | Status: DC

## 2020-07-09 NOTE — Progress Notes (Signed)
Subjective:    Patient ID: Johnathan Parsons., male    DOB: [birth year], 44 y.o.   MRN: [id number]  HPI The patient comes in today for a wellness visit.  Very nice gentleman We did talk about the importance of healthy diet portion control exercising and losing weight  A review of their health history was completed.  A review of medications was also completed.  Any needed refills; wellbutrin, diclofenac, lisinopril.   Eating habits: health conscious  Falls/  MVA accidents in past few months: none  Regular exercise: walks 2 -3 miles 2 -4 times per week  Specialist pt sees on regular basis: none  Preventative health issues were discussed.   Additional concerns: not taking lipitor. States it was causing headaches.  Results for orders placed or performed in visit on 05/12/20  Lipid panel  Result Value Ref Range   Cholesterol, Total 183 100 - 199 mg/dL   Triglycerides 742 0 - 149 mg/dL   HDL 31 (L) >59 mg/dL   VLDL Cholesterol Cal 19 5 - 40 mg/dL   LDL Chol Calc (NIH) 563 (H) 0 - 99 mg/dL   Chol/HDL Ratio 5.9 (H) 0.0 - 5.0 ratio  HIV Antibody (routine testing w rflx)  Result Value Ref Range   HIV Screen 4th Generation wRfx Non Reactive Non Reactive  Hepatic function panel  Result Value Ref Range   Total Protein 6.6 6.0 - 8.5 g/dL   Albumin 4.4 4.0 - 5.0 g/dL   Bilirubin Total 0.8 0.0 - 1.2 mg/dL   Bilirubin, Direct 8.75 0.00 - 0.40 mg/dL   Alkaline Phosphatase 102 44 - 121 IU/L   AST 20 0 - 40 IU/L   ALT 29 0 - 44 IU/L  Hepatitis C Antibody  Result Value Ref Range   Hep C Virus Ab <0.1 0.0 - 0.9 s/co ratio  Basic metabolic panel  Result Value Ref Range   Glucose 128 (H) 65 - 99 mg/dL   BUN 10 6 - 24 mg/dL   Creatinine, Ser 6.43 0.76 - 1.27 mg/dL   GFR calc non Af Amer 94 >59 mL/min/1.73   GFR calc Af Amer 109 >59 mL/min/1.73   BUN/Creatinine Ratio 10 9 - 20   Sodium 140 134 - 144 mmol/L   Potassium 4.5 3.5 - 5.2 mmol/L   Chloride 105 96 - 106 mmol/L   CO2 23 20  - 29 mmol/L   Calcium 9.4 8.7 - 10.2 mg/dL    Bad cholesterol mildly elevated  Review of Systems  Constitutional: Negative for activity change, appetite change and fever.  HENT: Negative for congestion and rhinorrhea.   Eyes: Negative for discharge.  Respiratory: Negative for cough and wheezing.   Cardiovascular: Negative for chest pain.  Gastrointestinal: Negative for abdominal pain, blood in stool and vomiting.  Genitourinary: Negative for difficulty urinating and frequency.  Musculoskeletal: Positive for arthralgias. Negative for neck pain.  Skin: Negative for rash.  Allergic/Immunologic: Negative for environmental allergies and food allergies.  Neurological: Negative for weakness and headaches.  Psychiatric/Behavioral: Negative for agitation.       Objective:   Physical Exam Constitutional:      Appearance: He is well-developed.  HENT:     Head: Normocephalic and atraumatic.     Right Ear: External ear normal.     Left Ear: External ear normal.     Nose: Nose normal.  Eyes:     Pupils: Pupils are equal, round, and reactive to light.  Neck:  Thyroid: No thyromegaly.  Cardiovascular:     Rate and Rhythm: Normal rate and regular rhythm.     Heart sounds: Normal heart sounds. No murmur heard.   Pulmonary:     Effort: Pulmonary effort is normal. No respiratory distress.     Breath sounds: Normal breath sounds. No wheezing.  Abdominal:     General: Bowel sounds are normal. There is no distension.     Palpations: Abdomen is soft. There is no mass.     Tenderness: There is no abdominal tenderness.  Genitourinary:    Penis: Normal.   Musculoskeletal:        General: Normal range of motion.     Cervical back: Normal range of motion and neck supple.  Lymphadenopathy:     Cervical: No cervical adenopathy.  Skin:    General: Skin is warm and dry.     Findings: No erythema.  Neurological:     Mental Status: He is alert.     Motor: No abnormal muscle tone.    Psychiatric:        Behavior: Behavior normal.        Judgment: Judgment normal.      Prostate exam not indicated     Assessment & Plan:  1. Routine general medical examination at a health care facility Adult wellness-complete.wellness physical was conducted today. Importance of diet and exercise were discussed in detail.  In addition to this a discussion regarding safety was also covered. We also reviewed over immunizations and gave recommendations regarding current immunization needed for age.  In addition to this additional areas were also touched on including: Preventative health exams needed:  Colonoscopy next 09/09/2024  Patient was advised yearly wellness exam  - Rheumatoid factor - C-reactive protein - Hemoglobin A1c  2. Arthralgia of both hands Unlikely to have rheumatoid arthritis but because of intermittent hand pain during the night will check lab work - Rheumatoid factor - C-reactive protein  3. Fasting hyperglycemia I am concerned about the possibility of developing diabetes he will check an A1c - Hemoglobin A1c  4. Erectile dysfunction, unspecified erectile dysfunction type Moderate erectile dysfunction discussed in detail patient chooses Cialis to try he will let us know if any problems warnings were discussed

## 2020-07-09 NOTE — Patient Instructions (Signed)

## 2020-07-10 LAB — HEMOGLOBIN A1C
Est. average glucose Bld gHb Est-mCnc: 111 mg/dL
Hgb A1c MFr Bld: 5.5 % (ref 4.8–5.6)

## 2020-07-10 LAB — C-REACTIVE PROTEIN: CRP: 2 mg/L (ref 0–10)

## 2020-07-10 LAB — RHEUMATOID FACTOR: Rheumatoid fact SerPl-aCnc: <10 IU/mL (ref 0.0–13.9)

## 2020-07-29 DIAGNOSIS — B353 Tinea pedis: Secondary | ICD-10-CM | POA: Diagnosis not present

## 2020-07-29 DIAGNOSIS — B356 Tinea cruris: Secondary | ICD-10-CM | POA: Diagnosis not present

## 2020-08-20 ENCOUNTER — Encounter: Payer: Self-pay | Admitting: Emergency Medicine

## 2020-08-20 ENCOUNTER — Ambulatory Visit
Admission: EM | Admit: 2020-08-20 | Discharge: 2020-08-20 | Disposition: A | Discharge status: Home / Self Care | Payer: BC Managed Care – PPO | Attending: Family Medicine | Admitting: Family Medicine

## 2020-08-20 DIAGNOSIS — Z1152 Encounter for screening for COVID-19: Secondary | ICD-10-CM

## 2020-08-20 DIAGNOSIS — H6690 Otitis media, unspecified, unspecified ear: Secondary | ICD-10-CM

## 2020-08-20 DIAGNOSIS — J0110 Acute frontal sinusitis, unspecified: Secondary | ICD-10-CM

## 2020-08-20 MED ORDER — FLUTICASONE PROPIONATE 50 MCG/ACT NA SUSP: 2.0000 | Freq: Every day | NASAL | 2 refills | Status: DC

## 2020-08-20 MED ORDER — AMOXICILLIN-POT CLAVULANATE 875-125 MG PO TABS: 1.0000 | ORAL_TABLET | Freq: Two times a day (BID) | ORAL | 0 refills | Status: DC

## 2020-08-20 NOTE — ED Provider Notes (Signed)
[facility]    CSN: [id number] Arrival date & time: 08/20/20  1154      History   Chief Complaint Chief Complaint  Patient presents with  . Nasal Congestion    HPI Johnathan Parsons. is a 44 y.o. male.   Patient is a 44 year old male who presents today with nasal congestion, facial pain, cough.  This is been present worsening over the past 5 days.  Taking over-the-counter medications without any relief.  No chills, fever, chest pain or shortness of breath.     Past Medical History:  Diagnosis Date  . Hepatic steatosis   . Hypertension   . Kidney stones 2009  . Ventral hernia     Patient Active Problem List   Diagnosis Date Noted  . Hyperlipidemia 03/14/2018  . Obstructive sleep apnea syndrome 03/11/2018  . Dyslipidemia 10/20/2015  . HTN (hypertension) 06/11/2014  . Tobacco use disorder 06/11/2014    Past Surgical History:  Procedure Laterality Date  . VASECTOMY    . WISDOM TOOTH EXTRACTION         Home Medications    Prior to Admission medications   Medication Sig Start Date End Date Taking? Authorizing Provider  amoxicillin-clavulanate (AUGMENTIN) 875-125 MG tablet Take 1 tablet by mouth every 12 (twelve) hours. 08/20/20  Yes Issak Goley A, NP  fluticasone (FLONASE) 50 MCG/ACT nasal spray Place 2 sprays into both nostrils daily. 08/20/20  Yes Tyson Parkison A, NP  albuterol (PROVENTIL HFA;VENTOLIN HFA) 108 (90 Base) MCG/ACT inhaler Inhale 2 puffs every 4 hours as needed for wheezing 09/02/18   Jeannine Boga, NP  atorvastatin (LIPITOR) 20 MG tablet Take one tablet each evening 09/11/18   Babs Sciara, MD  buPROPion (WELLBUTRIN SR) 150 MG 12 hr tablet Take 1 tablet (150 mg total) by mouth daily. 07/09/20   Babs Sciara, MD  diclofenac (VOLTAREN) 75 MG EC tablet Take 1 tablet (75 mg total) by mouth 2 (two) times daily. 09/11/18   Babs Sciara, MD  lisinopril (ZESTRIL) 5 MG tablet Take 1 tablet (5 mg total) by mouth daily. 07/09/20    Babs Sciara, MD  tadalafil (CIALIS) 20 MG tablet Take one qd prn before relations 07/09/20   Babs Sciara, MD    Family History Family History  Problem Relation Age of Onset  . Hypertension Father   . Diabetes Father   . Heart disease Father   . Colon polyps Father   . Liver disease Father   . Hypertension Maternal Grandmother   . Heart disease Maternal Grandmother   . Liver cancer Maternal Grandmother   . Kidney disease Maternal Grandmother   . Hypertension Maternal Grandfather   . Prostate cancer Maternal Grandfather   . Colon cancer Maternal Grandfather   . Hypertension Paternal Grandmother   . Hypertension Paternal Grandfather   . Pancreatic cancer Paternal Grandfather   . Gallbladder disease Neg Hx   . Esophageal cancer Neg Hx     Social History Social History   Tobacco Use  . Smoking status: Current Every Day Smoker    Packs/day: 1.00    Years: 25.00    Pack years: 25.00    Types: Cigarettes, E-cigarettes  . Smokeless tobacco: Never Used  Substance Use Topics  . Alcohol use: Yes    Alcohol/week: 2.0 standard drinks    Types: 2 Standard drinks or equivalent per week    Comment: Occassionally  . Drug use: No     Allergies   Oxycodone-acetaminophen  and Sulfa antibiotics   Review of Systems Review of Systems   Physical Exam Triage Vital Signs ED Triage Vitals  Enc Vitals Group     BP 08/20/20 1209 (!) 152/97     Pulse Rate 08/20/20 1209 85     Resp 08/20/20 1209 18     Temp 08/20/20 1209 98.6 F (37 C)     Temp Source 08/20/20 1209 Oral     SpO2 08/20/20 1209 96 %     Weight 08/20/20 1210 252 lb (114.3 kg)     Height 08/20/20 1210 6\' 1"  (1.854 m)     Head Circumference --      Peak Flow --      Pain Score 08/20/20 1210 0     Pain Loc --      Pain Edu? --      Excl. in GC? --    No data found.  Updated Vital Signs BP (!) 152/97 (BP Location: Right Arm)   Pulse 85   Temp 98.6 F (37 C) (Oral)   Resp 18   Ht 6\' 1"  (1.854 m)   Wt  252 lb (114.3 kg)   SpO2 96%   BMI 33.25 kg/m   Visual Acuity Right Eye Distance:   Left Eye Distance:   Bilateral Distance:    Right Eye Near:   Left Eye Near:    Bilateral Near:     Physical Exam Vitals and nursing note reviewed.  Constitutional:      General: He is not in acute distress.    Appearance: Normal appearance. He is not ill-appearing, toxic-appearing or diaphoretic.  HENT:     Head: Normocephalic and atraumatic.     Left Ear: Tympanic membrane and ear canal normal.     Ears:     Comments: Right TM erythematous and retracted    Nose: Congestion and rhinorrhea present.     Mouth/Throat:     Pharynx: Oropharynx is clear.  Eyes:     Conjunctiva/sclera: Conjunctivae normal.  Cardiovascular:     Rate and Rhythm: Normal rate.  Pulmonary:     Effort: Pulmonary effort is normal.     Breath sounds: Normal breath sounds.  Musculoskeletal:        General: Normal range of motion.     Cervical back: Normal range of motion.  Skin:    General: Skin is warm and dry.  Neurological:     Mental Status: He is alert.  Psychiatric:        Mood and Affect: Mood normal.      UC Treatments / Results  Labs (all labs ordered are listed, but only abnormal results are displayed) Labs Reviewed  NOVEL CORONAVIRUS, NAA    EKG   Radiology No results found.  Procedures Procedures (including critical care time)  Medications Ordered in UC Medications - No data to display  Initial Impression / Assessment and Plan / UC Course  I have reviewed the triage vital signs and the nursing notes.  Pertinent labs & imaging results that were available during my care of the patient were reviewed by me and considered in my medical decision making (see chart for details).     Sinusitis and right ear infection Treated with amoxicillin. Flonase nasal spray for nasal congestion, inflammation. Covid test pending. Follow up as needed for continued or worsening symptoms  Final  Clinical Impressions(s) / UC Diagnoses   Final diagnoses:  Encounter for screening for COVID-19  Acute otitis media, unspecified otitis media  type  Acute non-recurrent frontal sinusitis     Discharge Instructions     Treating you for a sinus infection and early ear infection.  Medicines as prescribed  Follow up as needed for continued or worsening symptoms     ED Prescriptions    Medication Sig Dispense Auth. Provider   amoxicillin-clavulanate (AUGMENTIN) 875-125 MG tablet Take 1 tablet by mouth every 12 (twelve) hours. 14 tablet Agapita Savarino A, NP   fluticasone (FLONASE) 50 MCG/ACT nasal spray Place 2 sprays into both nostrils daily. 16 g Dahlia Byes A, NP     PDMP not reviewed this encounter.   Janace Aris, NP 08/20/20 1310    Dahlia Byes A, NP 08/23/20 1615

## 2020-08-20 NOTE — Discharge Instructions (Signed)
Treating you for a sinus infection and early ear infection.  Medicines as prescribed  Follow up as needed for continued or worsening symptoms

## 2020-08-20 NOTE — ED Triage Notes (Signed)
Pt said since Monday has been having nasal congestion, facial pain with cough ann congestion. Taking OTC and nothing helping break up congestion. Pt said no chills, no fevers.

## 2020-08-24 LAB — NOVEL CORONAVIRUS, NAA: SARS-CoV-2, NAA: NOT DETECTED

## 2020-08-25 DIAGNOSIS — M7541 Impingement syndrome of right shoulder: Secondary | ICD-10-CM | POA: Diagnosis not present

## 2020-09-07 ENCOUNTER — Encounter: Payer: Self-pay | Admitting: Emergency Medicine

## 2020-09-07 ENCOUNTER — Ambulatory Visit
Admission: EM | Admit: 2020-09-07 | Discharge: 2020-09-07 | Disposition: A | Discharge status: Home / Self Care | Payer: BC Managed Care – PPO | Attending: Family Medicine | Admitting: Family Medicine

## 2020-09-07 ENCOUNTER — Other Ambulatory Visit: Payer: Self-pay

## 2020-09-07 DIAGNOSIS — R059 Cough, unspecified: Secondary | ICD-10-CM | POA: Diagnosis not present

## 2020-09-07 DIAGNOSIS — R519 Headache, unspecified: Secondary | ICD-10-CM | POA: Diagnosis not present

## 2020-09-07 DIAGNOSIS — B349 Viral infection, unspecified: Secondary | ICD-10-CM

## 2020-09-07 DIAGNOSIS — R509 Fever, unspecified: Secondary | ICD-10-CM

## 2020-09-07 DIAGNOSIS — R52 Pain, unspecified: Secondary | ICD-10-CM

## 2020-09-07 MED ORDER — PROMETHAZINE-DM 6.25-15 MG/5ML PO SYRP: 5.0000 mL | ORAL_SOLUTION | Freq: Four times a day (QID) | ORAL | 0 refills | Status: DC | PRN

## 2020-09-07 NOTE — ED Triage Notes (Signed)
Cough, fever congestion, body aches that started Sunday

## 2020-09-07 NOTE — Discharge Instructions (Signed)
I have sent in cough syrup for you to take. This medication can make you sleepy. Do not drive while taking this medication. ° °Your COVID test is pending. You should self quarantine until the test result is back.   ° °Take Tylenol or ibuprofen as needed for fever or discomfort. Rest and keep yourself hydrated.   ° °Follow-up with your primary care provider if your symptoms are not improving.  ° ° °

## 2020-09-08 NOTE — ED Provider Notes (Signed)
[facility]   [id number] 09/07/20 Arrival Time: 1112   CC: COVID symptoms  SUBJECTIVE: History from: patient.  Johnathan Parsons. is a 45 y.o. male who presents with cough, fever, congestion and body aches that started 3 days ago. Denies sick exposure to COVID, flu or strep. Denies recent travel. Has negative history of Covid. Has completed Covid vaccines. Has not taken OTC medications for this. There are no aggravating or alleviating factors. Denies previous symptoms in the past. Denies sinus pain, rhinorrhea, sore throat, SOB, wheezing, chest pain, nausea, changes in bowel or bladder habits.    ROS: As per HPI.  All other pertinent ROS negative.     Past Medical History:  Diagnosis Date  . Hepatic steatosis   . Hypertension   . Kidney stones 2009  . Ventral hernia    Past Surgical History:  Procedure Laterality Date  . VASECTOMY    . WISDOM TOOTH EXTRACTION     Allergies  Allergen Reactions  . Oxycodone-Acetaminophen Itching  . Sulfa Antibiotics Hives   No current facility-administered medications on file prior to encounter.   Current Outpatient Medications on File Prior to Encounter  Medication Sig Dispense Refill  . albuterol (PROVENTIL HFA;VENTOLIN HFA) 108 (90 Base) MCG/ACT inhaler Inhale 2 puffs every 4 hours as needed for wheezing 1 Inhaler 2  . amoxicillin-clavulanate (AUGMENTIN) 875-125 MG tablet Take 1 tablet by mouth every 12 (twelve) hours. 14 tablet 0  . atorvastatin (LIPITOR) 20 MG tablet Take one tablet each evening 90 tablet 1  . buPROPion (WELLBUTRIN SR) 150 MG 12 hr tablet Take 1 tablet (150 mg total) by mouth daily. 90 tablet 1  . diclofenac (VOLTAREN) 75 MG EC tablet Take 1 tablet (75 mg total) by mouth 2 (two) times daily. 60 tablet 1  . fluticasone (FLONASE) 50 MCG/ACT nasal spray Place 2 sprays into both nostrils daily. 16 g 2  . lisinopril (ZESTRIL) 5 MG tablet Take 1 tablet (5 mg total) by mouth daily. 90 tablet 1  . tadalafil (CIALIS)  20 MG tablet Take one qd prn before relations 5 tablet 4   Social History   Socioeconomic History  . Marital status: Married    Spouse name: Not on file  . Number of children: 5  . Years of education: Not on file  . Highest education level: Not on file  Occupational History  . Occupation: IT consultant  Tobacco Use  . Smoking status: Current Every Day Smoker    Packs/day: 1.00    Years: 25.00    Pack years: 25.00    Types: Cigarettes, E-cigarettes  . Smokeless tobacco: Never Used  Substance and Sexual Activity  . Alcohol use: Yes    Alcohol/week: 2.0 standard drinks    Types: 2 Standard drinks or equivalent per week    Comment: Occassionally  . Drug use: No  . Sexual activity: Not on file  Other Topics Concern  . Not on file  Social History Narrative  . Not on file   Social Determinants of Health   Financial Resource Strain: Not on file  Food Insecurity: Not on file  Transportation Needs: Not on file  Physical Activity: Not on file  Stress: Not on file  Social Connections: Not on file  Intimate Partner Violence: Not on file   Family History  Problem Relation Age of Onset  . Hypertension Father   . Diabetes Father   . Heart disease Father   . Colon polyps Father   .  Liver disease Father   . Hypertension Maternal Grandmother   . Heart disease Maternal Grandmother   . Liver cancer Maternal Grandmother   . Kidney disease Maternal Grandmother   . Hypertension Maternal Grandfather   . Prostate cancer Maternal Grandfather   . Colon cancer Maternal Grandfather   . Hypertension Paternal Grandmother   . Hypertension Paternal Grandfather   . Pancreatic cancer Paternal Grandfather   . Gallbladder disease Neg Hx   . Esophageal cancer Neg Hx     OBJECTIVE:  Vitals:   09/07/20 1133 09/07/20 1135  BP:  119/75  Pulse:  91  Resp:  19  Temp:  98.9 F (37.2 C)  TempSrc:  Oral  SpO2:  96%  Weight: 260 lb (117.9 kg)   Height: 5\' 11"  (1.803 m)       General appearance: alert; appears fatigued, but nontoxic; speaking in full sentences and tolerating own secretions HEENT: NCAT; Ears: EACs clear, TMs pearly gray; Eyes: PERRL.  EOM grossly intact. Sinuses: nontender; Nose: nares patent with clear rhinorrhea, Throat: oropharynx erythematous, cobblestoning present, tonsils non erythematous or enlarged, uvula midline  Neck: supple without LAD Lungs: unlabored respirations, symmetrical air entry; cough: mild; no respiratory distress; CTAB Heart: regular rate and rhythm.  Radial pulses 2+ symmetrical bilaterally Skin: warm and dry Psychological: alert and cooperative; normal mood and affect  LABS:  No results found for this or any previous visit (from the past 24 hour(s)).   ASSESSMENT & PLAN:  1. Viral illness   2. Cough   3. Nonintractable headache, unspecified chronicity pattern, unspecified headache type   4. Body aches   5. Fever, unspecified fever cause     Meds ordered this encounter  Medications  . promethazine-dextromethorphan (PROMETHAZINE-DM) 6.25-15 MG/5ML syrup    Sig: Take 5 mLs by mouth 4 (four) times daily as needed for cough.    Dispense:  118 mL    Refill:  0    Order Specific Question:   Supervising Provider    Answer:   10-23-1990 Merrilee Jansky   Promethazine cough syrup prescribed Sedation precautions given Continue supportive care at home COVID and flu testing ordered.  It will take between 2-3 days for test results. Someone will contact you regarding abnormal results.   Work note provided Patient should remain in quarantine until they have received Covid results.  If negative you may resume normal activities (go back to work/school) while practicing hand hygiene, social distance, and mask wearing.  If positive, patient should remain in quarantine for at least 5 days from symptom onset AND greater than 72 hours after symptoms resolution (absence of fever without the use of fever-reducing medication and  improvement in respiratory symptoms), whichever is longer Get plenty of rest and push fluids Use OTC zyrtec for nasal congestion, runny nose, and/or sore throat Use OTC flonase for nasal congestion and runny nose Use medications daily for symptom relief Use OTC medications like ibuprofen or tylenol as needed fever or pain Call or go to the ED if you have any new or worsening symptoms such as fever, worsening cough, shortness of breath, chest tightness, chest pain, turning blue, changes in mental status.  Reviewed expectations re: course of current medical issues. Questions answered. Outlined signs and symptoms indicating need for more acute intervention. Patient verbalized understanding. After Visit Summary given.         [[id number]], NP 09/08/20 [phone]

## 2020-09-09 LAB — COVID-19, FLU A+B NAA
Influenza A, NAA: NOT DETECTED
Influenza B, NAA: NOT DETECTED
SARS-CoV-2, NAA: DETECTED — AB

## 2020-09-24 DIAGNOSIS — M7541 Impingement syndrome of right shoulder: Secondary | ICD-10-CM | POA: Diagnosis not present

## 2020-10-13 DIAGNOSIS — M25511 Pain in right shoulder: Secondary | ICD-10-CM | POA: Diagnosis not present

## 2020-10-15 DIAGNOSIS — M7541 Impingement syndrome of right shoulder: Secondary | ICD-10-CM | POA: Diagnosis not present

## 2020-10-15 DIAGNOSIS — M25511 Pain in right shoulder: Secondary | ICD-10-CM | POA: Diagnosis not present

## 2020-10-21 DIAGNOSIS — S46011D Strain of muscle(s) and tendon(s) of the rotator cuff of right shoulder, subsequent encounter: Secondary | ICD-10-CM | POA: Diagnosis not present

## 2020-10-21 DIAGNOSIS — M7541 Impingement syndrome of right shoulder: Secondary | ICD-10-CM | POA: Diagnosis not present

## 2020-10-28 DIAGNOSIS — M7541 Impingement syndrome of right shoulder: Secondary | ICD-10-CM | POA: Diagnosis not present

## 2020-10-28 DIAGNOSIS — S46011D Strain of muscle(s) and tendon(s) of the rotator cuff of right shoulder, subsequent encounter: Secondary | ICD-10-CM | POA: Diagnosis not present

## 2020-11-03 DIAGNOSIS — M7541 Impingement syndrome of right shoulder: Secondary | ICD-10-CM | POA: Diagnosis not present

## 2020-11-03 DIAGNOSIS — S46011D Strain of muscle(s) and tendon(s) of the rotator cuff of right shoulder, subsequent encounter: Secondary | ICD-10-CM | POA: Diagnosis not present

## 2020-11-10 DIAGNOSIS — M7541 Impingement syndrome of right shoulder: Secondary | ICD-10-CM | POA: Diagnosis not present

## 2020-11-10 DIAGNOSIS — S46011D Strain of muscle(s) and tendon(s) of the rotator cuff of right shoulder, subsequent encounter: Secondary | ICD-10-CM | POA: Diagnosis not present

## 2020-11-16 DIAGNOSIS — S46011D Strain of muscle(s) and tendon(s) of the rotator cuff of right shoulder, subsequent encounter: Secondary | ICD-10-CM | POA: Diagnosis not present

## 2020-11-16 DIAGNOSIS — M7541 Impingement syndrome of right shoulder: Secondary | ICD-10-CM | POA: Diagnosis not present

## 2021-01-07 ENCOUNTER — Other Ambulatory Visit: Payer: Self-pay | Admitting: Family Medicine

## 2021-01-07 NOTE — Telephone Encounter (Signed)
Needs appt and then route back for refill 

## 2021-01-07 NOTE — Telephone Encounter (Signed)
Sent mychart message

## 2021-01-07 NOTE — Telephone Encounter (Signed)
Patient scheduled appointment for 6/20 for medication followup.

## 2021-01-30 ENCOUNTER — Other Ambulatory Visit: Payer: Self-pay | Admitting: Family Medicine

## 2021-02-03 ENCOUNTER — Other Ambulatory Visit: Payer: Self-pay | Admitting: Family Medicine

## 2021-02-07 ENCOUNTER — Ambulatory Visit: Payer: BC Managed Care – PPO | Admitting: Family Medicine

## 2021-03-03 ENCOUNTER — Encounter: Payer: Self-pay | Admitting: Family Medicine

## 2021-03-03 ENCOUNTER — Ambulatory Visit (INDEPENDENT_AMBULATORY_CARE_PROVIDER_SITE_OTHER): Payer: Commercial Managed Care - PPO | Admitting: Family Medicine

## 2021-03-03 ENCOUNTER — Other Ambulatory Visit: Payer: Self-pay

## 2021-03-03 VITALS — BP 136/84 | HR 75 | Temp 97.9°F | Ht 71.0 in | Wt 262.0 lb

## 2021-03-03 DIAGNOSIS — G4733 Obstructive sleep apnea (adult) (pediatric): Secondary | ICD-10-CM

## 2021-03-03 DIAGNOSIS — Z79899 Other long term (current) drug therapy: Secondary | ICD-10-CM

## 2021-03-03 DIAGNOSIS — I10 Essential (primary) hypertension: Secondary | ICD-10-CM

## 2021-03-03 DIAGNOSIS — E785 Hyperlipidemia, unspecified: Secondary | ICD-10-CM | POA: Diagnosis not present

## 2021-03-03 DIAGNOSIS — G4730 Sleep apnea, unspecified: Secondary | ICD-10-CM

## 2021-03-03 DIAGNOSIS — R7301 Impaired fasting glucose: Secondary | ICD-10-CM | POA: Diagnosis not present

## 2021-03-03 DIAGNOSIS — R635 Abnormal weight gain: Secondary | ICD-10-CM

## 2021-03-03 MED ORDER — TADALAFIL 20 MG PO TABS: ORAL_TABLET | ORAL | 12 refills | Status: DC

## 2021-03-03 MED ORDER — LISINOPRIL 5 MG PO TABS: 5.0000 mg | ORAL_TABLET | Freq: Every day | ORAL | 1 refills | Status: DC

## 2021-03-03 MED ORDER — DICLOFENAC SODIUM 75 MG PO TBEC: 75.0000 mg | DELAYED_RELEASE_TABLET | Freq: Two times a day (BID) | ORAL | 3 refills | Status: DC

## 2021-03-03 NOTE — Progress Notes (Signed)
  Subjective:    Patient ID: Johnathan Bay., male    DOB: February 07, 1976, 45 y.o.   MRN: [id number]  Hypertension This is a chronic problem. Treatments tried: lisinopril. There are no compliance problems (takes med every day, eats healthy, walks 3 -5 days a week).    Concerned about skeep alnea Tried different machines  Primary hypertension - Plan: Lipid panel, Hepatic function panel, Basic metabolic panel, Hemoglobin A1c  Obstructive sleep apnea syndrome - Plan: Lipid panel, Hepatic function panel, Basic metabolic panel, Hemoglobin A1c  Hyperlipidemia, unspecified hyperlipidemia type - Plan: Lipid panel, Hepatic function panel, Basic metabolic panel, Hemoglobin A1c  Fasting hyperglycemia - Plan: Lipid panel, Hepatic function panel, Basic metabolic panel, Hemoglobin A1c  Sleep apnea, unspecified type - Plan: Ambulatory referral to Neurology He does try to eat healthy but he does notice that his weight is gone up despite his best efforts He does do a lot of walking. He does have sleep apnea and does not tolerate the machine its been very difficult for him finds himself tired during the day He took himself off of cholesterol medicine because he felt his cholesterol looked good enough He is trying to eat healthy He does state that Cialis helps with erectile dysfunction Review of Systems     Objective:   Physical Exam  General-in no acute distress Eyes-no discharge Lungs-respiratory rate normal, CTA CV-no murmurs,RRR Extremities skin warm dry no edema Neuro grossly normal Behavior normal, alert       Assessment & Plan:  1. Primary hypertension Blood pressure good control continue current medication watch diet minimize starches - Lipid panel - Hepatic function panel - Basic metabolic panel - Hemoglobin A1c  2. Obstructive sleep apnea syndrome I am concerned about this apparently every mask and machine he has tried he has not been able to tolerate.  He has severe sleep  apnea.  Recommend consultation with sleep specialist.  He states he thrashes around a lot and it makes it difficult for him to keep the mask on - Lipid panel - Hepatic function panel - Basic metabolic panel - Hemoglobin A1c  3. Hyperlipidemia, unspecified hyperlipidemia type Patient stopped his cholesterol medicine feeling that healthy diet will help this he does state if his cholesterol significantly elevated he will restart medicine - Lipid panel - Hepatic function panel - Basic metabolic panel - Hemoglobin A1c  4. Fasting hyperglycemia Minimize starches in the diet stay physically active.  Avoid sugary drinks - Lipid panel - Hepatic function panel - Basic metabolic panel - Hemoglobin A1c  5. Sleep apnea, unspecified type See discussion above referral to Dr. Frances Furbish - Ambulatory referral to Neurology  6. Weight gain Moderate weight gain portion control regular activity recommended  Follow-up 6 months

## 2021-03-04 LAB — HEPATIC FUNCTION PANEL
ALT: 25 IU/L (ref 0–44)
AST: 16 IU/L (ref 0–40)
Albumin: 4.3 g/dL (ref 4.0–5.0)
Alkaline Phosphatase: 104 IU/L (ref 44–121)
Bilirubin Total: 0.5 mg/dL (ref 0.0–1.2)
Bilirubin, Direct: 0.15 mg/dL (ref 0.00–0.40)
Total Protein: 6.7 g/dL (ref 6.0–8.5)

## 2021-03-04 LAB — BASIC METABOLIC PANEL
BUN/Creatinine Ratio: 12 (ref 9–20)
BUN: 12 mg/dL (ref 6–24)
CO2: 25 mmol/L (ref 20–29)
Calcium: 9.5 mg/dL (ref 8.7–10.2)
Chloride: 102 mmol/L (ref 96–106)
Creatinine, Ser: 1.01 mg/dL (ref 0.76–1.27)
Glucose: 110 mg/dL — ABNORMAL HIGH (ref 65–99)
Potassium: 4.8 mmol/L (ref 3.5–5.2)
Sodium: 142 mmol/L (ref 134–144)
eGFR: 92 mL/min/{1.73_m2} (ref >59)

## 2021-03-04 LAB — LIPID PANEL
Chol/HDL Ratio: 5.8 ratio — ABNORMAL HIGH (ref 0.0–5.0)
Cholesterol, Total: 197 mg/dL (ref 100–199)
HDL: 34 mg/dL — ABNORMAL LOW (ref >39)
LDL Chol Calc (NIH): 145 mg/dL — ABNORMAL HIGH (ref 0–99)
Triglycerides: 98 mg/dL (ref 0–149)
VLDL Cholesterol Cal: 18 mg/dL (ref 5–40)

## 2021-03-04 LAB — HEMOGLOBIN A1C
Est. average glucose Bld gHb Est-mCnc: 111 mg/dL
Hgb A1c MFr Bld: 5.5 % (ref 4.8–5.6)

## 2021-03-07 MED ORDER — ATORVASTATIN CALCIUM 20 MG PO TABS: 20.0000 mg | ORAL_TABLET | Freq: Every day | ORAL | 1 refills | Status: DC

## 2021-03-07 NOTE — Addendum Note (Signed)
Addended by: Margaretha Sheffield on: 03/07/2021 03:59 PM   Modules accepted: Orders

## 2021-04-27 ENCOUNTER — Other Ambulatory Visit: Payer: Self-pay | Admitting: Family Medicine

## 2021-05-09 ENCOUNTER — Encounter: Payer: Self-pay | Admitting: Neurology

## 2021-05-09 ENCOUNTER — Ambulatory Visit: Payer: Commercial Managed Care - PPO | Admitting: Neurology

## 2021-05-09 ENCOUNTER — Other Ambulatory Visit: Payer: Self-pay

## 2021-05-09 VITALS — BP 133/82 | HR 72 | Ht 71.0 in | Wt 263.8 lb

## 2021-05-09 DIAGNOSIS — R635 Abnormal weight gain: Secondary | ICD-10-CM

## 2021-05-09 DIAGNOSIS — Z820 Family history of epilepsy and other diseases of the nervous system: Secondary | ICD-10-CM | POA: Diagnosis not present

## 2021-05-09 DIAGNOSIS — Z789 Other specified health status: Secondary | ICD-10-CM

## 2021-05-09 DIAGNOSIS — G4719 Other hypersomnia: Secondary | ICD-10-CM

## 2021-05-09 DIAGNOSIS — G4733 Obstructive sleep apnea (adult) (pediatric): Secondary | ICD-10-CM

## 2021-05-09 NOTE — Patient Instructions (Addendum)
Thank you for choosing [facility] for your sleep related care! It was nice to meet you today! I appreciate that you entrust me with your sleep related healthcare concerns. I hope, I was able to address at least some of your concerns today, and that I can help you feel reassured and also get better.    Here is what we discussed today and what we came up with as our plan for you:    Based on your symptoms and your exam I believe you are still at risk for obstructive sleep apnea and would benefit from reevaluation as it has been many years and you have struggled with your autoPAP machine. herefore, I think we should proceed with a sleep study to determine how severe your sleep apnea is. If you have more than mild OSA, I want you to consider CPAP, or you may benefit from a different machine called BiPAP.   Please remember, the risks and ramifications of moderate to severe obstructive sleep apnea or OSA are: Cardiovascular disease, including congestive heart failure, stroke, difficult to control hypertension, arrhythmias, and even type 2 diabetes has been linked to untreated OSA. Sleep apnea causes disruption of sleep and sleep deprivation in most cases, which, in turn, can cause recurrent headaches, problems with memory, mood, concentration, focus, and vigilance. Most people with untreated sleep apnea report excessive daytime sleepiness, which can affect their ability to drive. Please do not drive if you feel sleepy.   I will likely see you back after your sleep study to go over the test results and where to go from there. We will call you after your sleep study to advise about the results (most likely, you will hear from Darby, my nurse) and to set up an appointment at the time, as necessary.    Our sleep lab administrative assistant will call you to schedule your sleep study. If you don't hear back from her by about 2 weeks from now, please feel free to call her at [phone]. You can  leave a message with your phone number and concerns, if you get the voicemail box. She will call back as soon as possible.

## 2021-05-09 NOTE — Progress Notes (Signed)
Subjective:    Patient ID: Johnathan Parsons. is a 45 y.o. male.  HPI    Johnathan Foley, MD, PhD [facility] [address] [address] [place], [place] [address]  Dear Dr. Gerda Diss,   I saw your patient, Johnathan Parsons, upon your kind request in my sleep clinic today for initial consultation of his sleep disorder, in particular, evaluation of his prior diagnosis of obstructive sleep apnea.  The patient is unaccompanied today.  As you know, Johnathan Parsons is a 45 year old right-handed gentleman with an underlying medical history of hypertension, hyperlipidemia, kidney stone, ventral hernia, hepatic steatosis and obesity, who was previously diagnosed with obstructive sleep apnea.  He is currently no longer on his AutoPap, stopped using it a few months ago.  He has been struggling with tolerance, particularly with regards to the mass.  He reports that he tosses and turns and he is a restless sleeper and pulls off the mask in the middle of the night without realizing it.  Snoring has become worse and apneas are witnessed by his wife.  He has had worsening daytime somnolence.  He has gained weight over time.  His father has sleep apnea but does not have a CPAP machine, his maternal grandmother also has sleep apnea.  He received his current machine about 3 to 4 years ago which was more tolerable than his original machine.  We could not get a compliance download as the [employer] website was down.  He had a sleep study on 02/03/2005 which indicated an RDI of 16/h, O2 nadir 86%.  He was advised to start AutoPap therapy, study was interpreted by Dr. Marcelyn Bruins.  I reviewed your office note from 03/03/2021.  His Epworth sleepiness score is 19 out of 24, fatigue severity score is 48 out of 63.  He is married and lives with his wife, he has a total of 5 children, his 65 year old daughter lives with them.  He quit smoking some 4 years ago, he uses nicotine vapor.  He drinks alcohol up to 3  glasses of wine per week, caffeine in the form of coffee, 1 cup in the morning and 2 to 4 glasses of tea per day on average.  He does have a TV in his bedroom but does not watch it in his bedroom.  They have 1 dog in the household, dog does not typically sleep on the bed with them.  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  Bedtime is generally between 11 and 11:30 PM and rise time between 6:30 AM and 7 AM.  He does not have night to night nocturia or recurrent morning headaches.  His Past Medical History Is Significant For: Past Medical History:  Diagnosis Date   Hepatic steatosis    Hypertension    Kidney stones 2009   Ventral hernia     His Past Surgical History Is Significant For: Past Surgical History:  Procedure Laterality Date   VASECTOMY     WISDOM TOOTH EXTRACTION      His Family History Is Significant For: Family History  Problem Relation Age of Onset   Hypertension Father    Diabetes Father    Heart disease Father    Colon polyps Father    Liver disease Father    Sleep apnea Father    Hypertension Maternal Grandmother    Heart disease Maternal Grandmother    Liver cancer Maternal Grandmother    Kidney disease Maternal Grandmother  Sleep apnea Maternal Grandmother    Hypertension Maternal Grandfather    Prostate cancer Maternal Grandfather    Colon cancer Maternal Grandfather    Hypertension Paternal Grandmother    Hypertension Paternal Grandfather    Pancreatic cancer Paternal Grandfather    Gallbladder disease Neg Hx    Esophageal cancer Neg Hx     His Social History Is Significant For: Social History   Socioeconomic History   Marital status: Married    Spouse name: Not on file   Number of children: 5   Years of education: Not on file   Highest education level: Not on file  Occupational History   Occupation: IT consultant  Tobacco Use   Smoking status: Every Day    Types: E-cigarettes   Smokeless tobacco: Never   Vaping Use   Vaping Use: Every day  Substance and Sexual Activity   Alcohol use: Yes    Alcohol/week: 2.0 standard drinks    Types: 2 Standard drinks or equivalent per week    Comment: Occassionally   Drug use: No   Sexual activity: Not on file  Other Topics Concern   Not on file  Social History Narrative   Not on file   Social Determinants of Health   Financial Resource Strain: Not on file  Food Insecurity: Not on file  Transportation Needs: Not on file  Physical Activity: Not on file  Stress: Not on file  Social Connections: Not on file    His Allergies Are:  Allergies  Allergen Reactions   Oxycodone-Acetaminophen Itching   Sulfa Antibiotics Hives  :   His Current Medications Are:  Outpatient Encounter Medications as of 05/09/2021  Medication Sig   albuterol (PROVENTIL HFA;VENTOLIN HFA) 108 (90 Base) MCG/ACT inhaler Inhale 2 puffs every 4 hours as needed for wheezing   atorvastatin (LIPITOR) 20 MG tablet Take 1 tablet (20 mg total) by mouth daily.   diclofenac (VOLTAREN) 75 MG EC tablet Take 1 tablet (75 mg total) by mouth 2 (two) times daily.   lisinopril (ZESTRIL) 5 MG tablet Take 1 tablet (5 mg total) by mouth daily.   tadalafil (CIALIS) 20 MG tablet Take one qd prn before relations   No facility-administered encounter medications on file as of 05/09/2021.  :   Review of Systems:  Out of a complete 14 point review of systems, all are reviewed and negative with the exception of these symptoms as listed below:  Review of Systems  Neurological:        Pt is here for CPAP. Pt states he doesn't use CPAP because mask is not able to stay on. Changed Mask at least 3 times. Pt states it feels air is going down his  throat . Hasd not used in the past 3 months   ESS:19 FSS:48   Objective:  Neurological Exam  Physical Exam Physical Examination:   Vitals:   05/09/21 1328  BP: 133/82  Pulse: 72    General Examination: The patient is a very pleasant 45 y.o.  male in no acute distress. He appears well-developed and well-nourished and well groomed.   HEENT: Normocephalic, atraumatic, pupils are equal, round and reactive to light, extraocular tracking is good without limitation to gaze excursion or nystagmus noted. Hearing is grossly intact. Face is symmetric with normal facial animation. Speech is clear with no dysarthria noted. There is no hypophonia. There is no lip, neck/head, jaw or voice tremor. Neck is supple with full range of passive and active motion. There are  no carotid bruits on auscultation. Oropharynx exam reveals: mild mouth dryness, good dental hygiene and moderate airway crowding, due to wider uvula, Mallampati class IV, uvula and tonsils not fully visualized.  Tongue protrudes centrally.  Neck circumference of 18-1/2 inches.  Chest: Clear to auscultation without wheezing, rhonchi or crackles noted.  Heart: S1+S2+0, regular and normal without murmurs, rubs or gallops noted.   Abdomen: Soft, non-tender and non-distended with normal bowel sounds appreciated on auscultation.  Extremities: There is no pitting edema in the distal lower extremities bilaterally.   Skin: Warm and dry without trophic changes noted.   Musculoskeletal: exam reveals no obvious joint deformities, tenderness or joint swelling or erythema.   Neurologically:  Mental status: The patient is awake, alert and oriented in all 4 spheres. His immediate and remote memory, attention, language skills and fund of knowledge are appropriate. There is no evidence of aphasia, agnosia, apraxia or anomia. Speech is clear with normal prosody and enunciation. Thought process is linear. Mood is normal and affect is normal.  Cranial nerves II - XII are as described above under HEENT exam.  Motor exam: Normal bulk, strength and tone is noted. There is no tremor, fine motor skills and coordination: grossly intact.  Cerebellar testing: No dysmetria or intention tremor. There is no truncal or  gait ataxia.  Sensory exam: intact to light touch in the upper and lower extremities.  Gait, station and balance: He stands easily. No veering to one side is noted. No leaning to one side is noted. Posture is age-appropriate and stance is narrow based. Gait shows normal stride length and normal pace. No problems turning are noted.   Assessment and plan:   In summary, Johnathan Parsons. is a very pleasant 45 y.o.-year old male with an underlying medical history of hypertension, hyperlipidemia, kidney stone, ventral hernia, hepatic steatosis and obesity, who presents for evaluation of his obstructive sleep apnea.  He was diagnosed with overall moderate obstructive sleep apnea several years ago in 2006.  He reports worsening daytime somnolence and weight gain.  He has witnessed apneas per wife's feedback.  He has been struggling with his AutoPap machine.  He has tried different masks, most recently a fullface mask.  He is advised to proceed with reevaluation with a sleep study.  I had a long chat with the patient about my findings and the diagnosis of OSA, its prognosis and treatment options. We talked about medical treatments, surgical interventions and non-pharmacological approaches. I explained in particular the risks and ramifications of untreated moderate to severe OSA, especially with respect to developing cardiovascular disease down the Road, including congestive heart failure, difficult to treat hypertension, cardiac arrhythmias, or stroke. Even type 2 diabetes has, in part, been linked to untreated OSA. Symptoms of untreated OSA include daytime sleepiness, memory problems, mood irritability and mood disorder such as depression and anxiety, lack of energy, as well as recurrent headaches, especially morning headaches. We talked about trying to maintain a healthy lifestyle in general, as well as the importance of weight control. We also talked about the importance of good sleep hygiene. I recommended the  following at this time: sleep study.  Ultimately, he may benefit from a laboratory attended titration study.  I talked to him about the difference between a laboratory attended sleep study versus home sleep test as well. I explained the sleep test procedure to the patient and also outlined possible surgical and non-surgical treatment options of OSA, including the use of a custom-made dental device (  which would require a referral to a specialist dentist or oral surgeon), upper airway surgical options, such as traditional UPPP or a novel less invasive surgical option in the form of Inspire hypoglossal nerve stimulation (which would involve a referral to an ENT surgeon).  We will pick up our discussion after testing.  We will keep him posted as to his test results by phone call as well.  I answered all his questions today and he was in agreement.   Thank you very much for allowing me to participate in the care of this nice patient. If I can be of any further assistance to you please do not hesitate to call me at [phone].  Sincerely,   Johnathan Foley, MD, PhD

## 2021-05-31 ENCOUNTER — Encounter: Payer: Self-pay | Admitting: Family Medicine

## 2021-05-31 ENCOUNTER — Encounter: Payer: Self-pay | Admitting: Neurology

## 2021-06-17 NOTE — Telephone Encounter (Signed)
Nurses We will go ahead with ordering Saxenda Instructions 0.6 mg subcutaneous daily for the first week May increase by 0.6 mg each additional week if tolerated Goal is to titrate to 3 mg subcutaneous daily  In the previous MyChart message to the patient I did reference side effects he should read over these again  If he is having significant nausea he may need to go slower with the titration he should keep [place] updated with any questions  It is still important to follow healthy eating and regular activity  He should do weight on a weekly basis at home he should follow-up here 3 months after starting the medication.  It would be wise for him to send [place] an update regarding how he is doing in 1 month if he is having any severe abdominal pain with the medicine stop the medicine notify us

## 2021-06-20 ENCOUNTER — Other Ambulatory Visit: Payer: Self-pay | Admitting: Family Medicine

## 2021-06-20 MED ORDER — SAXENDA 18 MG/3ML ~~LOC~~ SOPN: PEN_INJECTOR | SUBCUTANEOUS | 0 refills | Status: DC

## 2021-07-25 ENCOUNTER — Other Ambulatory Visit: Payer: Self-pay | Admitting: Family Medicine

## 2021-08-23 ENCOUNTER — Encounter: Payer: Self-pay | Admitting: Family Medicine

## 2021-08-23 ENCOUNTER — Other Ambulatory Visit: Payer: Self-pay | Admitting: Family Medicine

## 2021-08-24 MED ORDER — SAXENDA 18 MG/3ML ~~LOC~~ SOPN: PEN_INJECTOR | SUBCUTANEOUS | 0 refills | Status: DC

## 2021-08-24 NOTE — Telephone Encounter (Signed)
Nurses May have 3 month refill Rec follow up by feb Please clarify with Junior the dose he is currently using Thanks Dr Nicki Reaper

## 2021-08-30 ENCOUNTER — Ambulatory Visit (INDEPENDENT_AMBULATORY_CARE_PROVIDER_SITE_OTHER): Payer: Commercial Managed Care - PPO | Admitting: Neurology

## 2021-08-30 ENCOUNTER — Other Ambulatory Visit: Payer: Self-pay

## 2021-08-30 DIAGNOSIS — G4720 Circadian rhythm sleep disorder, unspecified type: Secondary | ICD-10-CM

## 2021-08-30 DIAGNOSIS — G4733 Obstructive sleep apnea (adult) (pediatric): Secondary | ICD-10-CM | POA: Diagnosis not present

## 2021-08-30 DIAGNOSIS — R635 Abnormal weight gain: Secondary | ICD-10-CM

## 2021-08-30 DIAGNOSIS — Z789 Other specified health status: Secondary | ICD-10-CM

## 2021-08-30 DIAGNOSIS — G4719 Other hypersomnia: Secondary | ICD-10-CM

## 2021-08-30 DIAGNOSIS — Z820 Family history of epilepsy and other diseases of the nervous system: Secondary | ICD-10-CM

## 2021-08-30 DIAGNOSIS — G4734 Idiopathic sleep related nonobstructive alveolar hypoventilation: Secondary | ICD-10-CM

## 2021-09-04 ENCOUNTER — Other Ambulatory Visit: Payer: Self-pay | Admitting: Family Medicine

## 2021-09-06 ENCOUNTER — Ambulatory Visit: Payer: BC Managed Care – PPO | Admitting: Family Medicine

## 2021-09-06 ENCOUNTER — Other Ambulatory Visit: Payer: Self-pay

## 2021-09-06 VITALS — BP 128/87 | Temp 97.3°F | Ht 71.0 in | Wt 256.0 lb

## 2021-09-06 DIAGNOSIS — R7301 Impaired fasting glucose: Secondary | ICD-10-CM | POA: Diagnosis not present

## 2021-09-06 DIAGNOSIS — E785 Hyperlipidemia, unspecified: Secondary | ICD-10-CM | POA: Diagnosis not present

## 2021-09-06 DIAGNOSIS — G4733 Obstructive sleep apnea (adult) (pediatric): Secondary | ICD-10-CM | POA: Diagnosis not present

## 2021-09-06 DIAGNOSIS — I10 Essential (primary) hypertension: Secondary | ICD-10-CM | POA: Diagnosis not present

## 2021-09-06 MED ORDER — SAXENDA 18 MG/3ML ~~LOC~~ SOPN
PEN_INJECTOR | SUBCUTANEOUS | 3 refills | Status: DC
Start: 2021-09-06 — End: 2022-03-30

## 2021-09-06 MED ORDER — ATORVASTATIN CALCIUM 20 MG PO TABS: 20.0000 mg | ORAL_TABLET | Freq: Every day | ORAL | 1 refills | Status: DC

## 2021-09-06 MED ORDER — LISINOPRIL 5 MG PO TABS
5.0000 mg | ORAL_TABLET | Freq: Every day | ORAL | 1 refills | Status: DC
Start: 2021-09-06 — End: 2022-03-06

## 2021-09-06 NOTE — Progress Notes (Addendum)
° °  Subjective:    Patient ID: Johnathan Bay., male    DOB: 1975/11/18, 46 y.o.   MRN: [id number]  HPI Patient is here for a follow up on HTN, HLD, sleep apnea- had a sleep study done 2 weeks ago- no results received  Patient is working on weight loss with Saxenda.  He is tolerating the medicine well. Dietary is doing fairly well but he is taking him sweet tea for which he would be better if he could get away from that Also needs to do a little more walking only doing twice a week Stress levels are okay not depressed Tolerating other medicines well Blood pressure reportedly doing well Review of Systems     Objective:   Physical Exam  General-in no acute distress Eyes-no discharge Lungs-respiratory rate normal, CTA CV-no murmurs,RRR Extremities skin warm dry no edema Neuro grossly normal Behavior normal, alert       Assessment & Plan:  1. Primary hypertension Blood pressure good control continue current medications watch diet follow-up labs within 3 months - Lipid panel - Hepatic function panel - Basic metabolic panel - Hemoglobin A1c  2. Obstructive sleep apnea syndrome Sleep apnea await the sleep study being processed by neurology  3. Hyperlipidemia, unspecified hyperlipidemia type Continue cholesterol medicine healthy diet regular activity recommended - Lipid panel - Hepatic function panel - Basic metabolic panel - Hemoglobin A1c  4. Fasting hyperglycemia Check A1c minimize starches get away from sweet tea increase walking - Lipid panel - Hepatic function panel - Basic metabolic panel - Hemoglobin A1c  Morbid obesity-weight is coming down he was at the highest weight of 268 now 256 continue Saxenda patient is a send [place] an update within 2 to 3 months how he is doing we also discussed portion control regular physical activity and exercise and patient has follow-up by May or June BMI is 35.7 Current weight 256 Previous weight 262 Patient does qualify as  morbid obesity due to comorbidities and his BMI

## 2021-09-07 ENCOUNTER — Telehealth: Payer: Self-pay

## 2021-09-07 NOTE — Procedures (Signed)
PATIENT'S NAME:  Johnathan Parsons, Johnathan Parsons DOB:      [birth year]      MR#:    [id number]     DATE OF RECORDING: 08/30/2021 REFERRING M.D.:  Lilyan Punt, MD Study Performed:   Baseline Polysomnogram HISTORY: 46 year old right-handed gentleman with an underlying medical history of hypertension, hyperlipidemia, kidney stone, ventral hernia, hepatic steatosis and obesity, who was previously diagnosed with obstructive sleep apnea.  He is currently no longer on his AutoPAP. The patient endorsed the Epworth Sleepiness Scale at 19 points.  The patient's weight 263 pounds with a height of 71 (inches), resulting in a BMI of 36.7 kg/m2. The patient's neck circumference measured 18.5 inches.  CURRENT MEDICATIONS: Proventil HFA, Lipitor, Voltaren, Zestril, Cialis   PROCEDURE:  This is a multichannel digital polysomnogram utilizing the Somnostar 11.2 system.  Electrodes and sensors were applied and monitored per AASM Specifications.   EEG, EOG, Chin and Limb EMG, were sampled at 200 Hz.  ECG, Snore and Nasal Pressure, Thermal Airflow, Respiratory Effort, CPAP Flow and Pressure, Oximetry was sampled at 50 Hz. Digital video and audio were recorded.      BASELINE STUDY  Lights Out was at 22:24 and Lights On at 05:17. The patient qualified for an emergency split study, but did not wish to try CPAP during this study. He reported to the acquisition technologist, that he would move during sleep and pull the mask off. Total recording time (TRT) was 414 minutes, with a total sleep time (TST) of 337.5 minutes.   The patient's sleep latency was 19 minutes.  REM latency was 191 minutes, which is delayed. The sleep efficiency was 81.5 %.     SLEEP ARCHITECTURE: WASO (Wake after sleep onset) was 56.5 minutes with mild to moderate sleep fragmentation noted. There were 35 minutes in Stage N1, 244 minutes Stage N2, 11 minutes Stage N3 and 47.5 minutes in Stage REM.  The percentage of Stage N1 was 10.4%, which is increased, Stage N2 was 72.3%,  which is increased, Stage N3 was 3.3% and Stage R (REM sleep) was 14.1%, which is reduced. The arousals were noted as: 42 were spontaneous, 0 were associated with PLMs, 332 were associated with respiratory events.  RESPIRATORY ANALYSIS:  There were a total of 383 respiratory events:  368 obstructive apneas, 0 central apneas and 4 mixed apneas with a total of 372 apneas and an apnea index (AI) of 66.1 /hour. There were 11 hypopneas with a hypopnea index of 2. /hour. The patient also had 0 respiratory event related arousals (RERAs).      The total APNEA/HYPOPNEA INDEX (AHI) was 68.1/hour and the total RESPIRATORY DISTURBANCE INDEX was  68.1 /hour.  50 events occurred in REM sleep and 25 events in NREM. The REM AHI was  63.2 /hour, versus a non-REM AHI of 68.9. The patient spent 139 minutes of total sleep time in the supine position and 199 minutes in non-supine.. The supine AHI was 65.2 versus a non-supine AHI of 70.1.  OXYGEN SATURATION & C02:  The Wake baseline 02 saturation was 92%, with the lowest being 63%. Time spent below 89% saturation equaled 189 minutes.  PERIODIC LIMB MOVEMENTS: The patient had a total of 0 Periodic Limb Movements.  The Periodic Limb Movement (PLM) index was 0 and the PLM Arousal index was 0/hour.  Audio and video analysis did not show any abnormal or unusual movements, behaviors, phonations or vocalizations. The patient took one bathroom break. Moderate to loud snoring was noted. The EKG was in keeping  with normal sinus rhythm (NSR). Post-study, the patient indicated that sleep was worse than usual.   IMPRESSION: Severe Obstructive Sleep Apnea (OSA) Nocturnal hypoxemia Dysfunctions associated with sleep stages or arousal from sleep  RECOMMENDATIONS: This study demonstrates severe obstructive sleep apnea, with a total AHI of 68.1/hour, REM AHI of 63.2/hour, supine AHI of 65.2/hour and O2 nadir of 63%. He had significant hypoxemia with critical desaturations into the 60%  and significant time below 89% saturation of over 3 hours. He declined PAP therapy during the study and will be advised to return for a full night PAP titration study. Treatment with positive airway pressure in the form of CPAP is highly recommended. Alternative treatments are very limited, due to the critical severity of his sleep disordered breathing. He currently does not fulfill criteria for Inspire (hypoglossal nerve stimulator) placement. Concomitant weight loss is recommended. A surgically placed tracheostomy is a last resort treatment option. Please note that untreated obstructive sleep apnea may carry additional perioperative morbidity. Patients with significant obstructive sleep apnea should receive perioperative PAP therapy and the surgeons and particularly the anesthesiologist should be informed of the diagnosis and the severity of the sleep disordered breathing. This study shows sleep fragmentation and abnormal sleep stage percentages; these are nonspecific findings and per se do not signify an intrinsic sleep disorder or a cause for the patient's sleep-related symptoms. Causes include (but are not limited to) the first night effect of the sleep study, circadian rhythm disturbances, medication effect or an underlying mood disorder or medical problem.  The patient should be cautioned not to drive, work at heights, or operate dangerous or heavy equipment when tired or sleepy. Review and reiteration of good sleep hygiene measures should be pursued with any patient. The patient will be seen in follow-up in the sleep clinic at [facility] for discussion of the test results, symptom and treatment compliance review, further management strategies, etc. The referring provider will be notified of the test results.  I certify that I have reviewed the entire raw data recording prior to the issuance of this report in accordance with the Standards of Accreditation of the American Academy of Sleep Medicine  (AASM)  Huston Foley, MD, PhD Diplomat, American Board of Neurology and Sleep Medicine (Neurology and Sleep Medicine)

## 2021-09-07 NOTE — Telephone Encounter (Signed)
I called pt. I advised pt that Dr. Frances Furbish reviewed their sleep study results and found that severe OSA with critical desaturations were noted in the recent sleep study and recommends that pt be treated with a cpap. Dr. Frances Furbish recommends that pt return for a repeat sleep study in order to properly titrate the cpap and ensure a good mask fit. Pt is agreeable to returning for a titration study. I advised pt that our sleep lab will file with pt's insurance and call pt to schedule the sleep study when we hear back from the pt's insurance regarding coverage of this sleep study. Pt verbalized understanding of results. Pt had no questions at this time but was encouraged to call back if questions arise.

## 2021-09-07 NOTE — Addendum Note (Signed)
Addended by: Huston Foley on: 09/07/2021 07:47 AM   Modules accepted: Orders

## 2021-09-07 NOTE — Telephone Encounter (Signed)
Please prioritize/expedite auth for PAP titration.

## 2021-09-20 ENCOUNTER — Ambulatory Visit (INDEPENDENT_AMBULATORY_CARE_PROVIDER_SITE_OTHER): Payer: BC Managed Care – PPO | Admitting: Neurology

## 2021-09-20 ENCOUNTER — Other Ambulatory Visit: Payer: Self-pay

## 2021-09-20 DIAGNOSIS — Z820 Family history of epilepsy and other diseases of the nervous system: Secondary | ICD-10-CM

## 2021-09-20 DIAGNOSIS — G4719 Other hypersomnia: Secondary | ICD-10-CM

## 2021-09-20 DIAGNOSIS — G4733 Obstructive sleep apnea (adult) (pediatric): Secondary | ICD-10-CM | POA: Diagnosis not present

## 2021-09-20 DIAGNOSIS — G4761 Periodic limb movement disorder: Secondary | ICD-10-CM

## 2021-09-20 DIAGNOSIS — R635 Abnormal weight gain: Secondary | ICD-10-CM

## 2021-09-20 DIAGNOSIS — G4734 Idiopathic sleep related nonobstructive alveolar hypoventilation: Secondary | ICD-10-CM

## 2021-09-20 DIAGNOSIS — G4720 Circadian rhythm sleep disorder, unspecified type: Secondary | ICD-10-CM

## 2021-09-20 DIAGNOSIS — Z789 Other specified health status: Secondary | ICD-10-CM

## 2021-09-26 ENCOUNTER — Encounter: Payer: Self-pay | Admitting: Family Medicine

## 2021-09-26 ENCOUNTER — Telehealth: Payer: Self-pay | Admitting: Family Medicine

## 2021-09-26 NOTE — Telephone Encounter (Signed)
Please get patient's height so I can put that into his most previous note from January His note in January does include the diagnosis of morbid obesity Please see that note It should help with proper appeal Once you have a weight send it back to me

## 2021-09-26 NOTE — Telephone Encounter (Signed)
PA for Saxenda denied. Denied because pt did not meet criteria due to not having a condition that is supported. Please advise. Thank you

## 2021-09-27 NOTE — Telephone Encounter (Signed)
Documentation from his January visit, January 17 has clear documentation regarding his BMI morbid obesity and should qualify him for his medication

## 2021-09-27 NOTE — Addendum Note (Signed)
Addended by: Huston Foley on: 09/27/2021 06:04 PM   Modules accepted: Orders

## 2021-09-27 NOTE — Telephone Encounter (Signed)
PA re-submitted and recent office note faxed to [employer] [phone]

## 2021-09-27 NOTE — Telephone Encounter (Signed)
Pt contacted. Pt states he is 5'11. Please advise. Thank you

## 2021-09-27 NOTE — Procedures (Signed)
PATIENT'S NAME:  Johnathan Parsons, Johnathan Parsons. DOB:      Jan 17, 1976      MR#:    [id number]     DATE OF RECORDING: 09/20/2021 REFERRING M.D.:  Lilyan Punt, MD Study Performed:   CPAP  Titration HISTORY: 46 year old right-handed gentleman with an underlying medical history of hypertension, hyperlipidemia, kidney stone, ventral hernia, hepatic steatosis and obesity, who presents for a full night titration study. His baseline sleep study from 08/30/21 showed severe obstructive sleep apnea, with a total AHI of 68.1/hour, REM AHI of 63.2/hour, supine AHI of 65.2/hour and O2 nadir of 63%. He had significant hypoxemia with critical desaturations into the 60% and significant time below 89% saturation of over 3 hours. He declined PAP therapy during that study. The patient endorsed the Epworth Sleepiness Scale at 19 points. The patient's weight 263 pounds with a height of 71 (inches), resulting in a BMI of 36.7 kg/m2. The patient's neck circumference measured 18.5 inches.  CURRENT MEDICATIONS: Proventil HFA, Lipitor, Voltaren, Zestril, Cialis  PROCEDURE:  This is a multichannel digital polysomnogram utilizing the SomnoStar 11.2 system.  Electrodes and sensors were applied and monitored per AASM Specifications.   EEG, EOG, Chin and Limb EMG, were sampled at 200 Hz.  ECG, Snore and Nasal Pressure, Thermal Airflow, Respiratory Effort, CPAP Flow and Pressure, Oximetry was sampled at 50 Hz. Digital video and audio were recorded.      The patient was fitted with a small Vitera FFM from [employer]. CPAP was initiated at 6 cmH20 with heated humidity per AASM standards and pressure was advanced to 13 cmH20 gradually. The patient had trouble tolerating pressures above 10 cm and the pressure was intermittently scaled back. He was started on BiPAP of 13/9 cm for better tolerance and higher than 12 cm pressure requirement. On a BiPAP pressure of 13/9 cmH20, there was a reduction of the AHI to 0/hour with brief supine REM sleep achieved and O2  nadir of 92%.   Lights Out was at 22:32 and Lights On at 05:09. Total recording time (TRT) was 398 minutes, with a total sleep time (TST) of 277.5 minutes. The patient's sleep latency was 87 minutes, which is delayed. REM latency was 67 minutes, which is slightly reduced. The sleep efficiency was 69.7%, which is reduced.    SLEEP ARCHITECTURE: WASO (Wake after sleep onset)  was 81.5 minutes with mild to moderate sleep fragmentation noted. There were 39 minutes in Stage N1, 94.5 minutes Stage N2, 46.5 minutes Stage N3 and 97.5 minutes in Stage REM.  The percentage of Stage N1 was 14.1%, which is increased, Stage N2 was 34.1%, Stage N3 was 16.8% and Stage R (REM sleep) was 35.1%, which is increased, and in keeping with rebound. The arousals were noted as: 64 were spontaneous, 3 were associated with PLMs, 39 were associated with respiratory events.  RESPIRATORY ANALYSIS:  There was a total of 45 respiratory events: 9 obstructive apneas, 3 central apneas and 0 mixed apneas with a total of 12 apneas and an apnea index (AI) of 2.6 /hour. There were 33 hypopneas with a hypopnea index of 7.1/hour. The patient also had 0 respiratory event related arousals (RERAs).      The total APNEA/HYPOPNEA INDEX  (AHI) was 9.7 /hour and the total RESPIRATORY DISTURBANCE INDEX was 9.7 /hour  4 events occurred in REM sleep and 41 events in NREM. The REM AHI was 2.5 /hour versus a non-REM AHI of 13.7 /hour.  The patient spent 64 minutes of total sleep time in  the supine position and 214 minutes in non-supine. The supine AHI was 34.7, versus a non-supine AHI of 2.2.  OXYGEN SATURATION & C02:  The baseline 02 saturation was 96%, with the lowest being 85%. Time spent below 89% saturation equaled 1 minutes.  PERIODIC LIMB MOVEMENTS:  The patient had a total of 136 Periodic Limb Movements. The Periodic Limb Movement (PLM) index was 29.4/h and the PLM Arousal index was .6 /hour.  Audio and video analysis did not show any abnormal or  unusual movements, behaviors, phonations or vocalizations. The patient took one bathroom breaks. The EKG was in keeping with normal sinus rhythm (NSR). Snoring was significantly reduced.   Post-study, the patient indicated that sleep was worse than usual.   DIAGNOSIS:  Severe Obstructive Sleep Apnea  CPAP intolerance Periodic Limb Movement Disorder  Dysfunctions associated with sleep stages or arousals from sleep  RECOMMENDATIONS: This study demonstrates resolution of the patient's obstructive sleep apnea with BiPAP therapy. The patient had difficulty tolerating standard CPAP therapy but did well with standard BiPAP. I will, therefore, start the patient on home BiPAP treatment at a pressure of 13/9 cm via small FFM with heated humidity. The patient will be advised to be fully compliant with PAP therapy to improve sleep related symptoms and decrease long term cardiovascular risks. The patient should be reminded, that it may take up to 3 months to get fully used to using PAP with all planned sleep. The earlier full compliance is achieved, the better long term compliance tends to be. Please note that untreated obstructive sleep apnea may carry additional perioperative morbidity. Patients with significant obstructive sleep apnea should receive perioperative PAP therapy and the surgeons and particularly the anesthesiologist should be informed of the diagnosis and the severity of the sleep disordered breathing. Moderate PLMs (periodic limb movements of sleep) were noted during this study with no significant arousals; clinical correlation is recommended. PLMs may improve with OSA treatment.  This study shows sleep fragmentation and abnormal sleep stage percentages; these are nonspecific findings and per se do not signify an intrinsic sleep disorder or a cause for the patient's sleep-related symptoms. Causes include (but are not limited to) the first night effect of the sleep study, circadian rhythm  disturbances, medication effect or an underlying mood disorder or medical problem.  The patient should be cautioned not to drive, work at heights, or operate dangerous or heavy equipment when tired or sleepy. Review and reiteration of good sleep hygiene measures should be pursued with any patient. The patient will be seen in follow-up in the sleep clinic at [facility] for discussion of the test results, symptom and treatment compliance review, further management strategies, etc. The referring provider will be notified of the test results.   I certify that I have reviewed the entire raw data recording prior to the issuance of this report in accordance with the Standards of Accreditation of the American Academy of Sleep Medicine (AASM)   Huston Foley, MD, PhD Diplomat, American Board of Neurology and Sleep Medicine (Neurology and Sleep Medicine)

## 2021-09-28 ENCOUNTER — Telehealth: Payer: Self-pay

## 2021-09-28 NOTE — Telephone Encounter (Signed)
-----   Message from Huston Foley, MD sent at 09/27/2021  6:04 PM EST ----- Patient referred by Dr. Gerda Diss for re-eval of his OSA. He had stopped using his autoPAP. I saw him on 05/09/21, he had a diagnostic PSG on 08/30/21.   Patient had a CPAP titration study on 09/20/21.  Please call and inform patient that I have entered an order for treatment with positive airway pressure (PAP) treatment for obstructive sleep apnea (OSA). He did well during the latest sleep study with BiPAP. We will, therefore, arrange for a machine for home use through a DME (durable medical equipment) company of His choice; and I will see the patient back in follow-up in about 10 weeks. Please also explain to the patient that I will be looking out for compliance data, which can be downloaded from the machine (stored on an SD card, that is inserted in the machine) or via remote access through a modem, that is built into the machine. At the time of the followup appointment we will discuss sleep study results and how it is going with PAP treatment at home. Please advise patient to bring His machine at the time of the first FU visit, even though this is cumbersome. Bringing the machine for every visit after that will likely not be needed, but often helps for the first visit to troubleshoot if needed. Please re-enforce the importance of compliance with treatment and the need for [place] to monitor compliance data - often an insurance requirement and actually good feedback for the patient as far as how they are doing.  Also remind patient, that any interim PAP machine or mask issues should be first addressed with the DME company, as they can often help better with technical and mask fit issues. Please ask if patient has a preference regarding DME company.  Please also make sure, the patient has a follow-up appointment with me in about 10 weeks from the setup date, thanks. May see one of our nurse practitioners if needed for proper timing of the FU  appointment.  Please fax or rout report to the referring provider. Thanks,   Huston Foley, MD, PhD [facility])

## 2021-09-28 NOTE — Telephone Encounter (Signed)
I called pt. I advised pt that Dr. Frances Furbish reviewed their sleep study results and found that pt did well with a bipap during his latest sleep study. Dr. Frances Furbish recommends that pt start a bipap at home. I reviewed PAP compliance expectations with the pt. Pt is agreeable to starting a BiPAP. I advised pt that an order will be sent to a DME, [address], and [facility] will call the pt within about one week after they file with the pt's insurance. [facility] will show the pt how to use the machine, fit for masks, and troubleshoot the BiPAP if needed. A follow up appt was made for insurance purposes with Dr. Frances Furbish on 12/26/2021 at 8:45am. Pt verbalized understanding to arrive 15 minutes early and bring their BiPAP. A letter with all of this information in it will be mailed to the pt as a reminder. I verified with the pt that the address we have on file is correct. Pt verbalized understanding of results. Pt had no questions at this time but was encouraged to call back if questions arise. I have sent the order to [facility] and have received confirmation that they have received the order.

## 2021-10-05 NOTE — Telephone Encounter (Signed)
Contacted [employer] and checked on PA-They are faxing over another denial so we can do an appeal.

## 2021-10-14 NOTE — Telephone Encounter (Addendum)
Prior Approval and Appeal for Johnathan Parsons has been denied by patient's insurance. Insurance stated that page 50 of the patient's benefit plan states the insurance does not cover medications used solely for the purpose of weight loss   Patient was notified and verbalized understanding.

## 2021-11-24 ENCOUNTER — Encounter: Payer: Self-pay | Admitting: Neurology

## 2021-12-26 ENCOUNTER — Ambulatory Visit: Payer: BC Managed Care – PPO | Admitting: Neurology

## 2021-12-26 ENCOUNTER — Encounter: Payer: Self-pay | Admitting: Neurology

## 2021-12-26 VITALS — BP 119/73 | HR 71 | Ht 71.0 in | Wt 254.4 lb

## 2021-12-26 DIAGNOSIS — Z789 Other specified health status: Secondary | ICD-10-CM | POA: Diagnosis not present

## 2021-12-26 DIAGNOSIS — G4733 Obstructive sleep apnea (adult) (pediatric): Secondary | ICD-10-CM | POA: Diagnosis not present

## 2021-12-26 NOTE — Patient Instructions (Signed)
Please continue using your BiPAP regularly. While your insurance requires that you use BiPAP at least 4 hours each night on 70% of the nights, I recommend, that you not skip any nights and use it throughout the night if you can. Getting used to BiPAP and staying with the treatment long term does take time and patience and discipline. Untreated obstructive sleep apnea when it is moderate to severe can have an adverse impact on cardiovascular health and raise her risk for heart disease, arrhythmias, hypertension, congestive heart failure, stroke and diabetes. Untreated obstructive sleep apnea causes sleep disruption, nonrestorative sleep, and sleep deprivation. This can have an impact on your day to day functioning and cause daytime sleepiness and impairment of cognitive function, memory loss, mood disturbance, and problems focussing. Using BiPAP regularly can improve these symptoms. ? ?You are not quite there yet with regards to insurance mandated compliance with BiPAP therapy.  Given that you have severe obstructive sleep apnea, your treatment options are very limited and BiPAP is your best option.  I would like to consider increasing your pressure at the next visit, please schedule with a nurse practitioner in about 2 to 3 months and try to be consistent with your mask and you may be able to try a different mask style through your DME company.  Please get in touch with [facility] about chronic nasal pillows with a chinstrap. ?

## 2021-12-26 NOTE — Progress Notes (Signed)
Subjective:  ?  ?Patient ID: Johnathan Parsons. is a 46 y.o. male. ? ?HPI ? ? ? ?Interim history:  ? ?Johnathan Parsons is a 46 year old right-handed gentleman with an underlying medical history of hypertension, hyperlipidemia, kidney stone, ventral hernia, hepatic steatosis and obesity, who presents for follow-up consultation of his obstructive sleep apnea after interim testing and starting BiPAP therapy.  The patient is unaccompanied today.  I first met him at the request of his primary care physician on 05/09/2021, at which time he reported a prior diagnosis of obstructive sleep apnea and difficulty tolerating therapy.  He was advised to proceed with reevaluation.  He had a baseline sleep study, followed by a CPAP titration study.  Baseline sleep study from 08/30/2021 showed severe obstructive sleep apnea with an AHI of 68.1/h, REM AHI of 63.2/h, supine AHI of 65.2/h and O2 nadir of 63%.  He had significant hypoxemia with time below 89% saturation over 3 hours.  He declined CPAP therapy during that study although he qualified for any emergency splint at the time.  He had a subsequent titration study on 09/20/2021.  He was fitted with a fullface mask from [employer] and CPAP was titrated from 6 cm to 13 cm gradually.  He had trouble tolerating CPAP about 10 cm and this was despite scaling back on the pressure intermittently.  He was then started on BiPAP therapy at 13 over 9 cm and tolerated BiPAP.  His AHI was 0/h with brief supine REM sleep achieved and O2 nadir of 92%.  He was advised to start home BiPAP therapy.  His set up date was 10/24/2021.  He has a Retail banker. ? ?Today, 12/26/2021: I reviewed his BiPAP compliance data for the past nearly 2 months.  Between 10/24/2021 through 12/21/2021, which is a total of 59 days, he used his machine only 24 days with percent use days greater than 4 hours at 0%, indicating very low compliance with an average usage for days on treatment of only 1 hour and 28  minutes, residual AHI elevated at 42.8/h with primarily obstructive events, leak on the higher side with the 95th percentile at 28 L/min on a pressure of 13 over 9 cm.  He reports pulling the mask off in the middle of the night and not realizing it.  His wife typically sleeps through this.  He has not used his machine very much and admits that there are nights where he does not use it at all.  He is willing to get back on it more consistently and work on compliance.  He has 3 different mask.  He reports that his insurance did not pay for the machine because he has not lost weight.  He is advised to get in touch with his DME company.  He is advised to try a nasal pillows interface with a chinstrap which he may be able to tolerate a little better as he reports being a side sleeper and the mask is a full facemask and pushes into his face or dislodges sideways. ? ?The patient's allergies, current medications, family history, past medical history, past social history, past surgical history and problem list were reviewed and updated as appropriate.  ? ?Previously:  ? ?05/09/21: (He) was previously diagnosed with obstructive sleep apnea.  He is currently no longer on his AutoPap, stopped using it a few months ago.  He has been struggling with tolerance, particularly with regards to the mask.  He reports that he tosses  and turns and he is a restless sleeper and pulls off the mask in the middle of the night without realizing it.  Snoring has become worse and apneas are witnessed by his wife.  He has had worsening daytime somnolence.  He has gained weight over time.  His father has sleep apnea but does not have a CPAP machine, his maternal grandmother also has sleep apnea.  ?He received his current machine about 3 to 4 years ago which was more tolerable than his original machine.  We could not get a compliance download as the [employer]Med website was down.  He had a sleep study on 02/03/2005 which indicated an RDI of 16/h, O2 nadir  86%.  He was advised to start AutoPap therapy, study was interpreted by Dr. Danton Sewer.  ?I reviewed your office note from 03/03/2021.  His Epworth sleepiness score is 19 out of 24, fatigue severity score is 48 out of 63.  He is married and lives with his wife, he has a total of 5 children, his 31 year old daughter lives with them.  He quit smoking some 4 years ago, he uses nicotine vapor.  He drinks alcohol up to 3 glasses of wine per week, caffeine in the form of coffee, 1 cup in the morning and 2 to 4 glasses of tea per day on average.  He does have a TV in his bedroom but does not watch it in his bedroom.  They have 1 dog in the household, dog does not typically sleep on the bed with them.  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  Bedtime is generally between 11 and 11:30 PM and rise time between 6:30 AM and 7 AM.  He does not have night to night nocturia or recurrent morning headaches. ? ?His Past Medical History Is Significant For: ?Past Medical History:  ?Diagnosis Date  ? Hepatic steatosis   ? Hypertension   ? Kidney stones 2009  ? Ventral hernia   ? ? ?His Past Surgical History Is Significant For: ?Past Surgical History:  ?Procedure Laterality Date  ? VASECTOMY    ? WISDOM TOOTH EXTRACTION    ? ? ?His Family History Is Significant For: ?Family History  ?Problem Relation Age of Onset  ? Hypertension Father   ? Diabetes Father   ? Heart disease Father   ? Colon polyps Father   ? Liver disease Father   ? Sleep apnea Father   ? Hypertension Maternal Grandmother   ? Heart disease Maternal Grandmother   ? Liver cancer Maternal Grandmother   ? Kidney disease Maternal Grandmother   ? Sleep apnea Maternal Grandmother   ? Hypertension Maternal Grandfather   ? Prostate cancer Maternal Grandfather   ? Colon cancer Maternal Grandfather   ? Hypertension Paternal Grandmother   ? Hypertension Paternal Grandfather   ? Pancreatic cancer Paternal Grandfather   ? Gallbladder disease Neg Hx   ? Esophageal  cancer Neg Hx   ? ? ?His Social History Is Significant For: ?Social History  ? ?Socioeconomic History  ? Marital status: Married  ?  Spouse name: Not on file  ? Number of children: 5  ? Years of education: Not on file  ? Highest education level: Not on file  ?Occupational History  ? Occupation: Conservator, museum/gallery  ?Tobacco Use  ? Smoking status: Every Day  ?  Types: E-cigarettes  ? Smokeless tobacco: Never  ?Vaping Use  ? Vaping Use: Every day  ?Substance and Sexual Activity  ?  Alcohol use: Not Currently  ?  Alcohol/week: 1.0 standard drink  ?  Types: 1 Glasses of wine per week  ? Drug use: No  ? Sexual activity: Not on file  ?Other Topics Concern  ? Not on file  ?Social History Narrative  ? Not on file  ? ?Social Determinants of Health  ? ?Financial Resource Strain: Not on file  ?Food Insecurity: Not on file  ?Transportation Needs: Not on file  ?Physical Activity: Not on file  ?Stress: Not on file  ?Social Connections: Not on file  ? ? ?His Allergies Are:  ?Allergies  ?Allergen Reactions  ? Oxycodone-Acetaminophen Itching  ? Sulfa Antibiotics Hives  ?:  ? ?His Current Medications Are:  ?Outpatient Encounter Medications as of 12/26/2021  ?Medication Sig  ? albuterol (PROVENTIL HFA;VENTOLIN HFA) 108 (90 Base) MCG/ACT inhaler Inhale 2 puffs every 4 hours as needed for wheezing  ? atorvastatin (LIPITOR) 20 MG tablet Take 1 tablet (20 mg total) by mouth daily.  ? diclofenac (VOLTAREN) 75 MG EC tablet Take 1 tablet (75 mg total) by mouth 2 (two) times daily.  ? lisinopril (ZESTRIL) 5 MG tablet Take 1 tablet (5 mg total) by mouth daily.  ? Multiple Vitamin (MULTIVITAMIN PO) Take by mouth at bedtime.  ? tadalafil (CIALIS) 20 MG tablet Take one qd prn before relations  ? Liraglutide -Weight Management (SAXENDA) 18 MG/3ML SOPN 3 mg subcutaneous daily  ? ?No facility-administered encounter medications on file as of 12/26/2021.  ?: ? ?Review of Systems:  ?Out of a complete 14 point review of systems, all are reviewed and  negative with the exception of these symptoms as listed below: ? ?Review of Systems  ?Neurological:   ?     Pt is here for CPAP follow up  Pt states he is not doing well on CPAP . Pt states he falls asleep

## 2021-12-28 ENCOUNTER — Encounter: Payer: Self-pay | Admitting: Neurology

## 2022-01-02 ENCOUNTER — Other Ambulatory Visit: Payer: Self-pay | Admitting: Family Medicine

## 2022-01-02 ENCOUNTER — Encounter: Payer: Self-pay | Admitting: Family Medicine

## 2022-01-02 MED ORDER — TRAZODONE HCL 50 MG PO TABS: 25.0000 mg | ORAL_TABLET | Freq: Every evening | ORAL | 3 refills | Status: DC | PRN

## 2022-01-06 ENCOUNTER — Encounter: Payer: Self-pay | Admitting: Internal Medicine

## 2022-02-14 LAB — LIPID PANEL
Chol/HDL Ratio: 3.3 ratio (ref 0.0–5.0)
Cholesterol, Total: 112 mg/dL (ref 100–199)
HDL: 34 mg/dL — ABNORMAL LOW (ref >39)
LDL Chol Calc (NIH): 64 mg/dL (ref 0–99)
Triglycerides: 68 mg/dL (ref 0–149)
VLDL Cholesterol Cal: 14 mg/dL (ref 5–40)

## 2022-02-14 LAB — HEMOGLOBIN A1C
Est. average glucose Bld gHb Est-mCnc: 114 mg/dL
Hgb A1c MFr Bld: 5.6 % (ref 4.8–5.6)

## 2022-02-14 LAB — HEPATIC FUNCTION PANEL
ALT: 51 IU/L — ABNORMAL HIGH (ref 0–44)
AST: 28 IU/L (ref 0–40)
Albumin: 4.4 g/dL (ref 4.0–5.0)
Alkaline Phosphatase: 124 IU/L — ABNORMAL HIGH (ref 44–121)
Bilirubin Total: 0.5 mg/dL (ref 0.0–1.2)
Bilirubin, Direct: 0.19 mg/dL (ref 0.00–0.40)
Total Protein: 6.3 g/dL (ref 6.0–8.5)

## 2022-02-14 LAB — BASIC METABOLIC PANEL
BUN/Creatinine Ratio: 19 (ref 9–20)
BUN: 17 mg/dL (ref 6–24)
CO2: 21 mmol/L (ref 20–29)
Calcium: 9.3 mg/dL (ref 8.7–10.2)
Chloride: 107 mmol/L — ABNORMAL HIGH (ref 96–106)
Creatinine, Ser: 0.91 mg/dL (ref 0.76–1.27)
Glucose: 132 mg/dL — ABNORMAL HIGH (ref 70–99)
Potassium: 4.2 mmol/L (ref 3.5–5.2)
Sodium: 142 mmol/L (ref 134–144)
eGFR: 104 mL/min/{1.73_m2} (ref >59)

## 2022-03-06 ENCOUNTER — Encounter: Payer: Self-pay | Admitting: Family Medicine

## 2022-03-06 ENCOUNTER — Ambulatory Visit: Payer: BC Managed Care – PPO | Admitting: Family Medicine

## 2022-03-06 VITALS — BP 124/76 | HR 64 | Temp 97.2°F | Ht 71.0 in | Wt 251.0 lb

## 2022-03-06 DIAGNOSIS — K760 Fatty (change of) liver, not elsewhere classified: Secondary | ICD-10-CM

## 2022-03-06 DIAGNOSIS — I10 Essential (primary) hypertension: Secondary | ICD-10-CM

## 2022-03-06 DIAGNOSIS — E785 Hyperlipidemia, unspecified: Secondary | ICD-10-CM

## 2022-03-06 MED ORDER — LISINOPRIL 5 MG PO TABS
5.0000 mg | ORAL_TABLET | Freq: Every day | ORAL | 1 refills | Status: DC
Start: 2022-03-06 — End: 2022-08-28

## 2022-03-06 MED ORDER — ATORVASTATIN CALCIUM 20 MG PO TABS: 20.0000 mg | ORAL_TABLET | Freq: Every day | ORAL | 1 refills | Status: DC

## 2022-03-06 NOTE — Progress Notes (Signed)
   Subjective:    Patient ID: Johnathan Parsons., male    DOB: 13-May-1976, 46 y.o.   MRN: [id number]  Hypertension Treatments tried: lisinopril.   Discuss Saxenda medication/ was denied by the insurance  This patient has morbid obesity Also has hyperlipidemia and hypertension Is at increased risk of cardiovascular issues Was on Saxenda was losing weight significantly until his insurance decided to stop it because they stated it was not medically necessary.  Hard to know if this is their way of not paying for it or there might be a drug exclusion on his plan patient is uncertain it is hard to tell from any documentation here  We did discuss portion control regular physical activity and exercise Review of Systems     Objective:   Physical Exam  General-in no acute distress Eyes-no discharge Lungs-respiratory rate normal, CTA CV-no murmurs,RRR Extremities skin warm dry no edema Neuro grossly normal Behavior normal, alert  Lab work was reviewed A1c looks good LDL good control HDL looks good liver enzyme points toward fatty liver kidney function look good     Assessment & Plan:  1. Primary hypertension Blood pressure decent control continue current measures watch diet  2. Hyperlipidemia, unspecified hyperlipidemia type Continue medicine doing well overall  3. Morbid obesity (HCC) Morbid obesity with fatty liver hyperlipidemia hypertension at high risk of coronary artery disease as he gets older very important to lose weight Saxenda would help him.  We will try to get this approved again  Patient would benefit from the Saxenda.  We will try to get this approved.  If it does not get approved we will try to get a detailed explanation from his insurance company why it is not covered so that we can inform the patient why  Fatty liver-we will do follow-up lab work again on his follow-up visit in 4 to 6 months

## 2022-03-07 MED ORDER — SAXENDA 18 MG/3ML ~~LOC~~ SOPN: PEN_INJECTOR | SUBCUTANEOUS | 0 refills | Status: DC

## 2022-03-07 NOTE — Addendum Note (Signed)
Addended by: Marlowe Shores on: 03/07/2022 04:49 PM   Modules accepted: Orders

## 2022-03-07 NOTE — Progress Notes (Signed)
03/07/22-Saxenda reordered

## 2022-03-09 ENCOUNTER — Telehealth: Payer: Self-pay | Admitting: *Deleted

## 2022-03-09 NOTE — Telephone Encounter (Signed)
See phone message from Feb 2023-previous PA and Appeal attempt for Saxenda:   10/14/21  3:16 PM Note Prior Approval and Appeal for Johnathan Parsons has been denied by patient's insurance. Insurance stated that page 47 of the patient's benefit plan states the insurance does not cover medications used solely for the purpose of weight loss    Patient was notified and verbalized understanding.

## 2022-03-09 NOTE — Telephone Encounter (Signed)
Please either call patient or send him a MyChart message informing him of this.  He can call his insurance company to try to get a detailed explanation from them if he so desires.  If he needs anything else from [place] please let me know thank you

## 2022-03-10 NOTE — Telephone Encounter (Signed)
Patient notified and verbalized understanding. 

## 2022-03-29 ENCOUNTER — Telehealth: Payer: Self-pay | Admitting: *Deleted

## 2022-03-29 NOTE — Telephone Encounter (Signed)
I called pt and he was not using his machine consistently.  He states he would use for several hours then somehow he would take it off unawares.  He was last seen 12-26-2021.  He did contact them at [employer] for change of nasal pillow and chinstrap but it was going to be private pay (as he was not compliant).  Insurance would not pay,  so pt did not receive. From [employer], at [place] he is private pay at this time due to noncompliance.  She had same info in [place] as I did.  I kept his appt.

## 2022-03-30 ENCOUNTER — Ambulatory Visit: Payer: BC Managed Care – PPO | Admitting: Neurology

## 2022-03-30 ENCOUNTER — Encounter: Payer: Self-pay | Admitting: Neurology

## 2022-03-30 ENCOUNTER — Encounter: Payer: Self-pay | Admitting: Family Medicine

## 2022-03-30 VITALS — BP 147/96 | HR 66 | Ht 71.0 in | Wt 255.4 lb

## 2022-03-30 DIAGNOSIS — Z789 Other specified health status: Secondary | ICD-10-CM | POA: Diagnosis not present

## 2022-03-30 DIAGNOSIS — G4733 Obstructive sleep apnea (adult) (pediatric): Secondary | ICD-10-CM

## 2022-03-30 DIAGNOSIS — E669 Obesity, unspecified: Secondary | ICD-10-CM

## 2022-03-30 NOTE — Patient Instructions (Signed)
It was nice to see you again today.  Since you have rather severe OSA, I would not recommend, you leave it untreated.  Please keep trying to use your BiPAP, as other treatment options are limited. We may be able to consider you for Inspire, the tongue nerve stimulator, if your BMI gets down to below 32. Keep working on weight loss.  Try the [facility] Full face mask I provided as a sample today. It is a large, if you like it and would need a medium, I can always prescribe it for you.   Please continue using your BiPAP regularly. While your insurance requires that you use BiPAP at least 4 hours each night on 70% of the nights, I recommend, that you not skip any nights and use it throughout the night if you can. Getting used to BiPAP and staying with the treatment long term does take time and patience and discipline. Untreated obstructive sleep apnea when it is moderate to severe can have an adverse impact on cardiovascular health and raise her risk for heart disease, arrhythmias, hypertension, congestive heart failure, stroke and diabetes. Untreated obstructive sleep apnea causes sleep disruption, nonrestorative sleep, and sleep deprivation. This can have an impact on your day to day functioning and cause daytime sleepiness and impairment of cognitive function, memory loss, mood disturbance, and problems focussing. Using BiPAP regularly can improve these symptoms.  Follow up in about 4 months to see our nurse practitioner.

## 2022-03-30 NOTE — Telephone Encounter (Signed)
1.  I highly agree that 1 of these medications would be beneficial for his situation 2.  Based upon what our nurse found out from the insurance company his insurance company will not cover this for weight reduction even in this situation.  It appears that it is a plan exclusion.  If Junior would like for me to do a letter of support I will be happy to do so but he would have to work with his insurance company to see if they would approve for him in this situation according to his current guidelines with a plan exclusion there is nothing more that we can do

## 2022-03-30 NOTE — Progress Notes (Signed)
Subjective:    Patient ID: Johnathan Parsons. is a 46 y.o. male.  HPI    Interim history:   Johnathan Parsons is a 46 year old right-handed gentleman with an underlying medical history of hypertension, hyperlipidemia, kidney stone, ventral hernia, hepatic steatosis and obesity, who presents for follow-up consultation of his obstructive sleep apnea, on BiPAP therapy.  The patient is unaccompanied today. I last saw him on 12/26/21, at which time he was not using his BiPAP consistently.  He had an average usage of less than 2 hours in the months of March through May.  He reported that he had a tendency to pull the mask off in the middle of the night and not realizing it.  There were several nights where he did not use his machine at all.  He sent a message through Johnathan Parsons in the interim in May 2023 inquiring if a sleep aid like trazodone would help him.  He was advised to try melatonin for now.  He did get a prescription in the interim for trazodone through his primary care.  Today, 03/30/2022: I reviewed his compliance data from 01/07/2022 through 03/27/2022, which is a total of 90 days, during which time he used his machine 25 days, no usage since mid June with sporadic usage in early June and intermittent usage and mid May to mid June.  Average usage of 1 hour and 50 minutes, percent use days greater than 4 hours at 1% only.  Average AHI 19.4/h with primarily obstructive events, pressure of 13 over 9 cm, leak on the higher side with the 95th percentile at 30.6 L/min.  He reports not using his machine lately.  He has not used it essentially since June.  He tried trazodone but it made him too groggy the next day despite using only half a pill.  He no longer uses it.  He tried 3-4 different CPAP masks.  The most recent is a medium F20 fullface mask.  He feels that it consistently dislodges overnight.  He is working on weight loss, he was on Saxenda but it was denied by his insurance.  We talked about potential  alternative treatment options which are very limited.  He is potentially a candidate for inspire down the road so long as he is successful with his weight loss.  Our target would be a BMI of up to 32.  In the meantime, he is willing to keep trying the BiPAP and I provided him with a Respironics Amara fullface mask sample size large.  We talked about the importance of treating severe sleep apnea to prevent cardiovascular complications.  The patient's allergies, current medications, family history, past medical history, past social history, past surgical history and problem list were reviewed and updated as appropriate.    Previously:    I first met him at the request of his primary care physician on 05/09/2021, at which time he reported a prior diagnosis of obstructive sleep apnea and difficulty tolerating therapy.  He was advised to proceed with reevaluation.  He had a baseline sleep study, followed by a CPAP titration study.  Baseline sleep study from 08/30/2021 showed severe obstructive sleep apnea with an AHI of 68.1/h, REM AHI of 63.2/h, supine AHI of 65.2/h and O2 nadir of 63%.  He had significant hypoxemia with time below 89% saturation over 3 hours.  He declined CPAP therapy during that study although he qualified for any emergency split study at the time.  He had a subsequent titration study on  09/20/2021.  He was fitted with a fullface mask from [employer] and CPAP was titrated from 6 cm to 13 cm gradually.  He had trouble tolerating CPAP about 10 cm and this was despite scaling back on the pressure intermittently.  He was then started on BiPAP therapy at 13 over 9 cm and tolerated BiPAP.  His AHI was 0/h with brief supine REM sleep achieved and O2 nadir of 92%.  He was advised to start home BiPAP therapy.  His set up date was 10/24/2021.  He has a Retail banker.   I reviewed his BiPAP compliance data for the past nearly 2 months.  Between 10/24/2021 through 12/21/2021, which is a  total of 59 days, he used his machine only 24 days with percent use days greater than 4 hours at 0%, indicating very low compliance with an average usage for days on treatment of only 1 hour and 28 minutes, residual AHI elevated at 42.8/h with primarily obstructive events, leak on the higher side with the 95th percentile at 28 L/min on a pressure of 13 over 9 cm.      05/09/21: (He) was previously diagnosed with obstructive sleep apnea.  He is currently no longer on his AutoPap, stopped using it a few months ago.  He has been struggling with tolerance, particularly with regards to the mask.  He reports that he tosses and turns and he is a restless sleeper and pulls off the mask in the middle of the night without realizing it.  Snoring has become worse and apneas are witnessed by his wife.  He has had worsening daytime somnolence.  He has gained weight over time.  His father has sleep apnea but does not have a CPAP machine, his maternal grandmother also has sleep apnea.  He received his current machine about 3 to 4 years ago which was more tolerable than his original machine.  We could not get a compliance download as the [employer] website was down.  He had a sleep study on 02/03/2005 which indicated an RDI of 16/h, O2 nadir 86%.  He was advised to start AutoPap therapy, study was interpreted by Dr. Danton Sewer.  I reviewed your office note from 03/03/2021.  His Epworth sleepiness score is 19 out of 24, fatigue severity score is 48 out of 63.  He is married and lives with his wife, he has a total of 5 children, his 43 year old daughter lives with them.  He quit smoking some 4 years ago, he uses nicotine vapor.  He drinks alcohol up to 3 glasses of wine per week, caffeine in the form of coffee, 1 cup in the morning and 2 to 4 glasses of tea per day on average.  He does have a TV in his bedroom but does not watch it in his bedroom.  They have 1 dog in the household, dog does not typically sleep on the bed with them.  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  Bedtime is generally between 11 and 11:30 PM and rise time between 6:30 AM and 7 AM.  He does not have night to night nocturia or recurrent morning headaches.  His Past Medical History Is Significant For: Past Medical History:  Diagnosis Date   Hepatic steatosis    Hypertension    Kidney stones 2009   Ventral hernia     His Past Surgical History Is Significant For: Past Surgical History:  Procedure Laterality Date   VASECTOMY  WISDOM TOOTH EXTRACTION      His Family History Is Significant For: Family History  Problem Relation Age of Onset   Hypertension Father    Diabetes Father    Heart disease Father    Colon polyps Father    Liver disease Father    Sleep apnea Father    Hypertension Maternal Grandmother    Heart disease Maternal Grandmother    Liver cancer Maternal Grandmother    Kidney disease Maternal Grandmother    Sleep apnea Maternal Grandmother    Hypertension Maternal Grandfather    Prostate cancer Maternal Grandfather    Colon cancer Maternal Grandfather    Hypertension Paternal Grandmother    Hypertension Paternal Grandfather    Pancreatic cancer Paternal Grandfather    Gallbladder disease Neg Hx    Esophageal cancer Neg Hx     His Social History Is Significant For: Social History   Socioeconomic History   Marital status: Married    Spouse name: Not on file   Number of children: 5   Years of education: Not on file   Highest education level: Not on file  Occupational History   Occupation: Conservator, museum/gallery  Tobacco Use   Smoking status: Every Day    Types: E-cigarettes   Smokeless tobacco: Never  Vaping Use   Vaping Use: Every day   Start date: 09/17/2017   Devices: juul  Substance and Sexual Activity   Alcohol use: Not Currently    Alcohol/week: 1.0 standard drink of alcohol    Types: 1 Glasses of wine per week   Drug use: No   Sexual activity: Not on file  Other Topics  Concern   Not on file  Social History Narrative   Not on file   Social Determinants of Health   Financial Resource Strain: Not on file  Food Insecurity: Not on file  Transportation Needs: Not on file  Physical Activity: Not on file  Stress: Not on file  Social Connections: Not on file    His Allergies Are:  Allergies  Allergen Reactions   Oxycodone-Acetaminophen Itching   Sulfa Antibiotics Hives  :   His Current Medications Are:  Outpatient Encounter Medications as of 03/30/2022  Medication Sig   albuterol (PROVENTIL HFA;VENTOLIN HFA) 108 (90 Base) MCG/ACT inhaler Inhale 2 puffs every 4 hours as needed for wheezing   atorvastatin (LIPITOR) 20 MG tablet Take 1 tablet (20 mg total) by mouth daily.   diclofenac (VOLTAREN) 75 MG EC tablet Take 1 tablet (75 mg total) by mouth 2 (two) times daily.   lisinopril (ZESTRIL) 5 MG tablet Take 1 tablet (5 mg total) by mouth daily.   Multiple Vitamin (MULTIVITAMIN PO) Take by mouth at bedtime.   tadalafil (CIALIS) 20 MG tablet Take one qd prn before relations   traZODone (DESYREL) 50 MG tablet Take 0.5-1 tablets (25-50 mg total) by mouth at bedtime as needed for sleep. (Patient not taking: Reported on 03/30/2022)   [DISCONTINUED] Liraglutide -Weight Management (SAXENDA) 18 MG/3ML SOPN 3 mg subcutaneous daily (Patient not taking: Reported on 03/06/2022)   [DISCONTINUED] Liraglutide -Weight Management (SAXENDA) 18 MG/3ML SOPN Inject 0.6 mg subcutaneous daily for the first week. May increase by 0.6 mg each additional week if tolerated. Goal is to titrate to 3 mg subcutaneous daily   No facility-administered encounter medications on file as of 03/30/2022.  :  Review of Systems:  Out of a complete 14 point review of systems, all are reviewed and negative with the exception of these  symptoms as listed below:  Review of Systems  Neurological:        Bipap follow up.  Did not get new mask or chinstrap due to private pay as he is noncomplaint and  insurance was not going to pay.  (This after speaking to [employer]). ESS 12, FSS 37.  Pt tried trazodone and melatonin did not help.  Relates that when used masks is awake till early hours,, but when not using will fall asleep straight away.      Objective:  Neurological Exam  Physical Exam Physical Examination:   Vitals:   03/30/22 0933  BP: (!) 147/96  Pulse: 66    General Examination: The patient is a very pleasant 46 y.o. male in no acute distress. He appears well-developed and well-nourished and well groomed.   HEENT: Normocephalic, atraumatic, pupils are equal, round and reactive to light, extraocular tracking is good without limitation to gaze excursion or nystagmus noted. Hearing is grossly intact. Face is symmetric with normal facial animation, speech is clear without dysarthria, hypophonia or voice tremor. Neck with FROM. There are no carotid bruits on auscultation. Oropharynx exam reveals: No significant mouth dryness, good dental hygiene and moderate airway crowding.  Tongue protrudes centrally and palate elevates symmetrically.     Chest: Clear to auscultation without wheezing, rhonchi or crackles noted.   Heart: S1+S2+0, regular and normal without murmurs, rubs or gallops noted.    Abdomen: Soft, non-tender and non-distended.   Extremities: There is no obvious edema in the distal lower extremities bilaterally.   Skin: Warm and dry without trophic changes noted.    Musculoskeletal: exam reveals no obvious joint deformities.    Neurologically:  Mental status: The patient is awake, alert and oriented in all 4 spheres. His immediate and remote memory, attention, language skills and fund of knowledge are appropriate. There is no evidence of aphasia, agnosia, apraxia or anomia. Speech is clear with normal prosody and enunciation. Thought process is linear. Mood is normal and affect is normal.  Cranial nerves II - XII are as described above under HEENT exam.  Motor  exam: Normal bulk, strength and tone is noted. There is no obvious tremor, fine motor skills and coordination: grossly intact.  Cerebellar testing: No dysmetria or intention tremor. There is no truncal or gait ataxia.  Sensory exam: intact to light touch in the upper and lower extremities.  Gait, station and balance: He stands easily. No veering to one side is noted. No leaning to one side is noted. Posture is age-appropriate and stance is narrow based. Gait shows normal stride length and normal pace. No problems turning are noted.    Assessment and plan:    In summary, Egypt Marchiano. is a very pleasant 46 year old male with an underlying medical history of hypertension, hyperlipidemia, kidney stone, ventral hernia, hepatic steatosis and obesity, who presents for follow-up consultation of his severe obstructive sleep apnea.  His baseline sleep study from 08/30/2021 showed an AHI of 68.1/h, O2 nadir 63%.  He did relatively well with BiPAP during his titration study on 09/20/2021.  He has not been able to be consistent with his BiPAP.  His compliance is low, he has essentially stopped using his machine since June 2023.  He has residual obstructive events even on BiPAP therapy, he could not tolerate CPAP.  His treatment options are limited.  He has tried several different masks and is agreeable to trying the Respironics Amara fullface mask.  We talked about inspire and  the indications.  He is advised to continue to strive for weight loss, once he reaches a BMI of 32 or less, we can reassess his sleep apnea with a home sleep test and consider inspire treatment.  For now, he is encouraged to continue to work on his BiPAP compliance. He is advised to follow-up to see one of our nurse practitioners in about 3 to 4 months, sooner if needed.  We may be able to increase the pressure to 14 over 10 cm on his BiPAP at the time so long as he has been able to be consistent with treatment and has adjusted to treatment  better. I answered all his questions today and he was in agreement with our plan. I spent 40 minutes in total face-to-face time and in reviewing records during pre-charting, more than 50% of which was spent in counseling and coordination of care, reviewing test results, reviewing medications and treatment regimen and/or in discussing or reviewing the diagnosis of OSA, the prognosis and treatment options. Pertinent laboratory and imaging test results that were available during this visit with the patient were reviewed by me and considered in my medical decision making (see chart for details).

## 2022-04-18 ENCOUNTER — Encounter: Payer: Self-pay | Admitting: Neurology

## 2022-04-20 NOTE — Telephone Encounter (Signed)
[facility] [employer] returned my call. She states insurance will no longer pay for the Bipap because patient has remainded noncompliant. Their last contact with the patient was in May and prior to that he was told by several techs that he needed to use his machine or else insurance wouldn't pay. I was told the bill that was sent to him for $260 was the private pay price that patient could pay in order to keep his machine OR he has to turn in his machine. He has a CPAP machine that he owned already. If he wants to restart BiPap he has restart the process through insurance which likely would  mean he has have another titration sleep study first.

## 2022-06-13 ENCOUNTER — Encounter: Payer: Self-pay | Admitting: Family Medicine

## 2022-06-14 MED ORDER — SILDENAFIL CITRATE 100 MG PO TABS
ORAL_TABLET | ORAL | 6 refills | Status: DC
Start: 2022-06-14 — End: 2023-01-08

## 2022-06-14 NOTE — Telephone Encounter (Signed)
May utilize sildenafil 100 mg-generic of Viagra-1/2 tablet or a whole tablet 1 hour before relations no more than 1/day #8 with 6 refills  Discontinue Cialis

## 2022-08-10 ENCOUNTER — Telehealth: Payer: Self-pay

## 2022-08-10 NOTE — Telephone Encounter (Signed)
Called Pt about Appointment on December 26th and asked if pt was being compliant with BiPAP Machine. Pt stated that he hasn't been able to wear BiPAP Mask because he feels choked up with it on. Pt decided to [facility] Appointment and call us back to Reschedule.

## 2022-08-10 NOTE — Progress Notes (Deleted)
[facility] [address] [place]. [place] [address] [phone]       OFFICE FOLLOW UP NOTE  [name]. Date of Birth:  1976-02-04 Medical Record Number:  [id number]   Reason for visit: Initial CPAP follow-up    SUBJECTIVE:   CHIEF COMPLAINT:  No chief complaint on file.   HPI:   Update 08/15/2022 JM: Patient returns for BiPAP compliance visit.  Prior visit with Dr. Frances Parsons 8/10 noted low compliance and essentially stopped using his machine since June.  Discussed trialing a different mask and he was amenable to retrialing BiPAP.  Discussed weight loss for possible candidate for inspire.            ROS:   14 system review of systems performed and negative with exception of those listed in HPI  PMH:  Past Medical History:  Diagnosis Date   Hepatic steatosis    Hypertension    Kidney stones 2009   Ventral hernia     PSH:  Past Surgical History:  Procedure Laterality Date   VASECTOMY     WISDOM TOOTH EXTRACTION      Social History:  Social History   Socioeconomic History   Marital status: Married    Spouse name: Not on file   Number of children: 5   Years of education: Not on file   Highest education level: Not on file  Occupational History   Occupation: IT consultant  Tobacco Use   Smoking status: Every Day    Types: E-cigarettes   Smokeless tobacco: Never  Vaping Use   Vaping Use: Every day   Start date: 09/17/2017   Devices: juul  Substance and Sexual Activity   Alcohol use: Not Currently    Alcohol/week: 1.0 standard drink of alcohol    Types: 1 Glasses of wine per week   Drug use: No   Sexual activity: Not on file  Other Topics Concern   Not on file  Social History Narrative   Not on file   Social Determinants of Health   Financial Resource Strain: Not on file  Food Insecurity: Not on file  Transportation Needs: Not on file  Physical Activity: Not on file  Stress: Not on file   Social Connections: Not on file  Intimate Partner Violence: Not on file    Family History:  Family History  Problem Relation Age of Onset   Hypertension Father    Diabetes Father    Heart disease Father    Colon polyps Father    Liver disease Father    Sleep apnea Father    Hypertension Maternal Grandmother    Heart disease Maternal Grandmother    Liver cancer Maternal Grandmother    Kidney disease Maternal Grandmother    Sleep apnea Maternal Grandmother    Hypertension Maternal Grandfather    Prostate cancer Maternal Grandfather    Colon cancer Maternal Grandfather    Hypertension Paternal Grandmother    Hypertension Paternal Grandfather    Pancreatic cancer Paternal Grandfather    Gallbladder disease Neg Hx    Esophageal cancer Neg Hx     Medications:   Current Outpatient Medications on File Prior to Visit  Medication Sig Dispense Refill   sildenafil (VIAGRA) 100 MG tablet Take 1/2-1 tablet po one hour before relations. No more than one per day. 8 tablet 6   albuterol (PROVENTIL HFA;VENTOLIN HFA) 108 (90 Base) MCG/ACT inhaler Inhale 2 puffs every 4 hours as needed for wheezing 1 Inhaler 2  atorvastatin (LIPITOR) 20 MG tablet Take 1 tablet (20 mg total) by mouth daily. 90 tablet 1   diclofenac (VOLTAREN) 75 MG EC tablet Take 1 tablet (75 mg total) by mouth 2 (two) times daily. 60 tablet 3   lisinopril (ZESTRIL) 5 MG tablet Take 1 tablet (5 mg total) by mouth daily. 90 tablet 1   Multiple Vitamin (MULTIVITAMIN PO) Take by mouth at bedtime.     traZODone (DESYREL) 50 MG tablet Take 0.5-1 tablets (25-50 mg total) by mouth at bedtime as needed for sleep. (Patient not taking: Reported on 03/30/2022) 30 tablet 3   No current facility-administered medications on file prior to visit.    Allergies:   Allergies  Allergen Reactions   Oxycodone-Acetaminophen Itching   Sulfa Antibiotics Hives      OBJECTIVE:  Physical Exam  There were no vitals filed for this  visit. There is no height or weight on file to calculate BMI. No results found.   General: well developed, well nourished, seated, in no evident distress Head: head normocephalic and atraumatic.   Neck: supple with no carotid or supraclavicular bruits Cardiovascular: regular rate and rhythm, no murmurs Musculoskeletal: no deformity Skin:  no rash/petichiae Vascular:  Normal pulses all extremities   Neurologic Exam Mental Status: Awake and fully alert. Oriented to place and time. Recent and remote memory intact. Attention span, concentration and fund of knowledge appropriate. Mood and affect appropriate.  Cranial Nerves: Pupils equal, briskly reactive to light. Extraocular movements full without nystagmus. Visual fields full to confrontation. Hearing intact. Facial sensation intact. Face, tongue, palate moves normally and symmetrically.  Motor: Normal bulk and tone. Normal strength in all tested extremity muscles Sensory.: intact to touch , pinprick , position and vibratory sensation.  Coordination: Rapid alternating movements normal in all extremities. Finger-to-nose and heel-to-shin performed accurately bilaterally. Gait and Station: Arises from chair without difficulty. Stance is normal. Gait demonstrates normal stride length and balance without use of AD. Tandem walk and heel toe without difficulty.  Reflexes: 1+ and symmetric. Toes downgoing.         ASSESSMENT/PLAN: Johnathan Parsons. is a 46 y.o. year old male    OSA on CPAP : Compliance report shows satisfactory usage with optimal residual AHI.  Discussed continued nightly usage with ensuring greater than 4 hours nightly for optimal benefit and per insurance purposes.  Continue to follow with DME company for any needed supplies or CPAP related concerns     Follow up in *** or call earlier if needed   CC:  PCP: Johnathan Sciara, MD    I spent *** minutes of face-to-face and non-face-to-face time with patient.  This  included previsit chart review, lab review, study review, order entry, electronic health record documentation, patient education regarding diagnosis of sleep apnea with review and discussion of compliance report and answered all other questions to patient's satisfaction   Ihor Austin, [facility]  [facility] [address] [place], [place] [address]  Phone [phone] Fax [phone] Note: This document was prepared with digital dictation and possible smart phrase technology. Any transcriptional errors that result from this process are unintentional.

## 2022-08-15 ENCOUNTER — Ambulatory Visit: Payer: BC Managed Care – PPO | Admitting: Adult Health

## 2022-08-21 ENCOUNTER — Ambulatory Visit
Admission: EM | Admit: 2022-08-21 | Discharge: 2022-08-21 | Disposition: A | Discharge status: Home / Self Care | Payer: BC Managed Care – PPO | Attending: Family Medicine | Admitting: Family Medicine

## 2022-08-21 ENCOUNTER — Encounter: Payer: Self-pay | Admitting: Family Medicine

## 2022-08-21 ENCOUNTER — Encounter: Payer: Self-pay | Admitting: Emergency Medicine

## 2022-08-21 DIAGNOSIS — J22 Unspecified acute lower respiratory infection: Secondary | ICD-10-CM | POA: Diagnosis not present

## 2022-08-21 MED ORDER — PROMETHAZINE-DM 6.25-15 MG/5ML PO SYRP: 5.0000 mL | ORAL_SOLUTION | Freq: Four times a day (QID) | ORAL | 0 refills | Status: DC | PRN

## 2022-08-21 MED ORDER — AMOXICILLIN-POT CLAVULANATE 875-125 MG PO TABS: 1.0000 | ORAL_TABLET | Freq: Two times a day (BID) | ORAL | 0 refills | Status: DC

## 2022-08-21 NOTE — ED Triage Notes (Signed)
Pt reports chest congestion, nasal congestion, cough, and shortness of breath. States symptoms have been ongoing for about 3 weeks and seem to be getting worse. Has been taking Nyquil, Sudafed, Dayquil and Mucinex with no relief.

## 2022-08-21 NOTE — ED Provider Notes (Signed)
[facility]    CSN: [id number] Arrival date & time: 08/21/22  1120      History   Chief Complaint Chief Complaint  Patient presents with   Cough   Nasal Congestion   Shortness of Breath    HPI Johnathan Parsons. is a 47 y.o. male.   Patient presenting today with 3-week history of progressively worsening nasal congestion, facial pain and pressure, chest congestion, cough, shortness of breath at times.  Denies chest pain, abdominal pain, nausea vomiting diarrhea, rashes.  Has been trying DayQuil, NyQuil, Sudafed, Mucinex with no relief.  States he does have history of mild seasonal allergies, does not take anything for this.  No known sick contacts recently.    Past Medical History:  Diagnosis Date   Hepatic steatosis    Hypertension    Kidney stones 2009   Ventral hernia     Patient Active Problem List   Diagnosis Date Noted   Morbid obesity ([place]) 03/06/2022   Fatty liver 03/06/2022   Hyperlipidemia 03/14/2018   Obstructive sleep apnea syndrome 03/11/2018   Dyslipidemia 10/20/2015   HTN (hypertension) 06/11/2014   Tobacco use disorder 06/11/2014    Past Surgical History:  Procedure Laterality Date   VASECTOMY     WISDOM TOOTH EXTRACTION         Home Medications    Prior to Admission medications   Medication Sig Start Date End Date Taking? Authorizing Provider  amoxicillin-clavulanate (AUGMENTIN) 875-125 MG tablet Take 1 tablet by mouth every 12 (twelve) hours. 08/21/22  Yes Volney American, PA-C  promethazine-dextromethorphan (PROMETHAZINE-DM) 6.25-15 MG/5ML syrup Take 5 mLs by mouth 4 (four) times daily as needed. 08/21/22  Yes Volney American, PA-C  sildenafil (VIAGRA) 100 MG tablet Take 1/2-1 tablet po one hour before relations. No more than one per day. 06/14/22   Kathyrn Drown, MD  albuterol (PROVENTIL HFA;VENTOLIN HFA) 108 (90 Base) MCG/ACT inhaler Inhale 2 puffs every 4 hours as needed for wheezing 09/02/18   Cheyenne Adas,  NP  atorvastatin (LIPITOR) 20 MG tablet Take 1 tablet (20 mg total) by mouth daily. 03/06/22   Kathyrn Drown, MD  diclofenac (VOLTAREN) 75 MG EC tablet Take 1 tablet (75 mg total) by mouth 2 (two) times daily. 03/03/21   Kathyrn Drown, MD  lisinopril (ZESTRIL) 5 MG tablet Take 1 tablet (5 mg total) by mouth daily. 03/06/22   Kathyrn Drown, MD  Multiple Vitamin (MULTIVITAMIN PO) Take by mouth at bedtime.    [provider]  traZODone (DESYREL) 50 MG tablet Take 0.5-1 tablets (25-50 mg total) by mouth at bedtime as needed for sleep. Patient not taking: Reported on 03/30/2022 01/02/22   Kathyrn Drown, MD    Family History Family History  Problem Relation Age of Onset   Hypertension Father    Diabetes Father    Heart disease Father    Colon polyps Father    Liver disease Father    Sleep apnea Father    Hypertension Maternal Grandmother    Heart disease Maternal Grandmother    Liver cancer Maternal Grandmother    Kidney disease Maternal Grandmother    Sleep apnea Maternal Grandmother    Hypertension Maternal Grandfather    Prostate cancer Maternal Grandfather    Colon cancer Maternal Grandfather    Hypertension Paternal Grandmother    Hypertension Paternal Grandfather    Pancreatic cancer Paternal Grandfather    Gallbladder disease Neg Hx    Esophageal cancer Neg  Hx     Social History Social History   Tobacco Use   Smoking status: Every Day    Types: E-cigarettes   Smokeless tobacco: Never  Vaping Use   Vaping Use: Every day   Start date: 09/17/2017   Devices: juul  Substance Use Topics   Alcohol use: Not Currently    Alcohol/week: 1.0 standard drink of alcohol    Types: 1 Glasses of wine per week   Drug use: No     Allergies   Oxycodone-acetaminophen and Sulfa antibiotics   Review of Systems Review of Systems Per HPI  Physical Exam Triage Vital Signs ED Triage Vitals  Enc Vitals Group     BP 08/21/22 1356 (!) 144/85     Pulse Rate 08/21/22 1356  78     Resp 08/21/22 1356 18     Temp 08/21/22 1356 98 F (36.7 C)     Temp Source 08/21/22 1356 Oral     SpO2 08/21/22 1356 96 %     Weight --      Height --      Head Circumference --      Peak Flow --      Pain Score 08/21/22 1357 0     Pain Loc --      Pain Edu? --      Excl. in [place]? --    No data found.  Updated Vital Signs BP (!) 144/85 (BP Location: Right Arm)   Pulse 78   Temp 98 F (36.7 C) (Oral)   Resp 18   SpO2 96%   Visual Acuity Right Eye Distance:   Left Eye Distance:   Bilateral Distance:    Right Eye Near:   Left Eye Near:    Bilateral Near:     Physical Exam Vitals and nursing note reviewed.  Constitutional:      Appearance: He is well-developed.  HENT:     Head: Atraumatic.     Right Ear: External ear normal.     Left Ear: External ear normal.     Nose: Congestion present.     Mouth/Throat:     Pharynx: Posterior oropharyngeal erythema present. No oropharyngeal exudate.  Eyes:     Conjunctiva/sclera: Conjunctivae normal.     Pupils: Pupils are equal, round, and reactive to light.  Cardiovascular:     Rate and Rhythm: Normal rate and regular rhythm.  Pulmonary:     Effort: Pulmonary effort is normal. No respiratory distress.     Breath sounds: No wheezing or rales.  Musculoskeletal:        General: Normal range of motion.     Cervical back: Normal range of motion and neck supple.  Lymphadenopathy:     Cervical: No cervical adenopathy.  Skin:    General: Skin is warm and dry.  Neurological:     Mental Status: He is alert and oriented to person, place, and time.  Psychiatric:        Behavior: Behavior normal.      UC Treatments / Results  Labs (all labs ordered are listed, but only abnormal results are displayed) Labs Reviewed - No data to display  EKG   Radiology No results found.  Procedures Procedures (including critical care time)  Medications Ordered in UC Medications - No data to display  Initial Impression /  Assessment and Plan / UC Course  I have reviewed the triage vital signs and the nursing notes.  Pertinent labs & imaging results that were available  during my care of the patient were reviewed by me and considered in my medical decision making (see chart for details).     Vital signs and exam overall reassuring today, given duration and worsening course, will cover with Augmentin, Phenergan DM, supportive over-the-counter medications and home care.  Recommended starting an allergy regimen daily.  Return for worsening symptoms.  Final Clinical Impressions(s) / UC Diagnoses   Final diagnoses:  Lower respiratory infection   Discharge Instructions   None    ED Prescriptions     Medication Sig Dispense Auth. Provider   amoxicillin-clavulanate (AUGMENTIN) 875-125 MG tablet Take 1 tablet by mouth every 12 (twelve) hours. 14 tablet Volney American, [place]   promethazine-dextromethorphan (PROMETHAZINE-DM) 6.25-15 MG/5ML syrup Take 5 mLs by mouth 4 (four) times daily as needed. 100 mL Volney American, [place]      PDMP not reviewed this encounter.   Volney American, [place] 08/21/22 1720

## 2022-08-28 ENCOUNTER — Ambulatory Visit: Payer: BC Managed Care – PPO | Admitting: Family Medicine

## 2022-08-28 ENCOUNTER — Encounter: Payer: Self-pay | Admitting: Family Medicine

## 2022-08-28 ENCOUNTER — Ambulatory Visit (HOSPITAL_COMMUNITY)
Admission: RE | Admit: 2022-08-28 | Discharge: 2022-08-28 | Disposition: A | Discharge status: Home / Self Care | Payer: BC Managed Care – PPO | Source: Ambulatory Visit | Attending: Family Medicine | Admitting: Family Medicine

## 2022-08-28 VITALS — BP 108/73 | Wt 260.2 lb

## 2022-08-28 DIAGNOSIS — E785 Hyperlipidemia, unspecified: Secondary | ICD-10-CM

## 2022-08-28 DIAGNOSIS — I10 Essential (primary) hypertension: Secondary | ICD-10-CM | POA: Diagnosis not present

## 2022-08-28 DIAGNOSIS — J22 Unspecified acute lower respiratory infection: Secondary | ICD-10-CM

## 2022-08-28 DIAGNOSIS — J019 Acute sinusitis, unspecified: Secondary | ICD-10-CM | POA: Insufficient documentation

## 2022-08-28 DIAGNOSIS — R7301 Impaired fasting glucose: Secondary | ICD-10-CM

## 2022-08-28 MED ORDER — ALBUTEROL SULFATE HFA 108 (90 BASE) MCG/ACT IN AERS: INHALATION_SPRAY | RESPIRATORY_TRACT | 2 refills | Status: AC

## 2022-08-28 MED ORDER — DOXYCYCLINE HYCLATE 100 MG PO TABS: 100.0000 mg | ORAL_TABLET | Freq: Two times a day (BID) | ORAL | 0 refills | Status: DC

## 2022-08-28 MED ORDER — ATORVASTATIN CALCIUM 20 MG PO TABS: 20.0000 mg | ORAL_TABLET | Freq: Every day | ORAL | 1 refills | Status: DC

## 2022-08-28 MED ORDER — LISINOPRIL 5 MG PO TABS: 5.0000 mg | ORAL_TABLET | Freq: Every day | ORAL | 1 refills | Status: DC

## 2022-08-28 NOTE — Progress Notes (Signed)
   Subjective:    Patient ID: Johnathan Fanny., male    DOB: Feb 16, 1976, 47 y.o.   MRN: [id number]  HPI Pt arrives for follow up. Blood pressure has been going. No issues.  Lower respiratory issues for 4 weeks-cough, nasal congestion. Waking up at night due to phelgm in chest. Today is last day for Augmentin.   Primary hypertension  Hyperlipidemia, unspecified hyperlipidemia type  Lower resp. tract infection  Acute rhinosinusitis  Fasting hyperglycemia  Morbid obesity ([place]), Chronic Patient relates that his weight has gone up.  He is trying to watch his diet to some degree.  He knows he needs to do better He states several weeks of head congestion drainage coughing and chest congestion he is a smoker he knows he needs to quit states he will be working on trying to cut back on smoking Relates a lot of starches in his diet and trying to do a good job taking his cholesterol medicine previous labs reviewed Review of Systems     Objective:   Physical Exam  Gen-NAD not toxic TMS-normal bilateral T- normal no redness Chest-CTA respiratory rate normal no crackles CV RRR no murmur Skin-warm dry Neuro-grossly normal       Assessment & Plan:  1. Primary hypertension HTN- patient seen for follow-up regarding HTN.   Diet, medication compliance, appropriate labs and refills were completed.   Importance of keeping blood pressure under good control to lessen the risk of complications discussed Regular follow-up visits discussed   2. Hyperlipidemia, unspecified hyperlipidemia type Hyperlipidemia-importance of diet, weight control, activity, compliance with medications discussed.   Recent labs reviewed.   Any additional labs or refills ordered.   Importance of keeping under good control discussed. Regular follow-up visits discussed   3. Lower resp. tract infection Ongoing illness for 4 weeks patient is a smoker he has been counseled to quit.  Patient will be working on trying  to quit in 2024.  In addition to this because of prolonged coughing for several weeks I would recommend chest x-ray.  4. Acute rhinosinusitis Doxycycline 10 days take with a snack tall glass of water  5. Fasting hyperglycemia Check A1c  6. Morbid obesity ([place]) The patient's BMI is calculated.  The patient does have obesity.  The patient does try to some degree staying active and watching diet.  It is in the vital signs and acknowledged.  It is above the recommended BMI for the patient's height and weight.  The patient has been counseled regarding healthy diet, restricted portions, avoiding excessive carbohydrates/sugary foods, and increase physical activity as health permits.  It is in the patient's best interest to lower the risk of secondary illness including heart disease strokes and cancer by losing weight.  The patient acknowledges this information.  Elevated liver enzymes recheck liver profile

## 2023-01-08 ENCOUNTER — Ambulatory Visit (INDEPENDENT_AMBULATORY_CARE_PROVIDER_SITE_OTHER): Payer: BC Managed Care – PPO | Admitting: Family Medicine

## 2023-01-08 ENCOUNTER — Other Ambulatory Visit: Payer: Self-pay | Admitting: Family Medicine

## 2023-01-08 VITALS — BP 134/84 | HR 67 | Temp 97.5°F | Ht 71.0 in | Wt 263.0 lb

## 2023-01-08 DIAGNOSIS — G4733 Obstructive sleep apnea (adult) (pediatric): Secondary | ICD-10-CM

## 2023-01-08 DIAGNOSIS — I10 Essential (primary) hypertension: Secondary | ICD-10-CM

## 2023-01-08 DIAGNOSIS — E785 Hyperlipidemia, unspecified: Secondary | ICD-10-CM

## 2023-01-08 MED ORDER — SILDENAFIL CITRATE 100 MG PO TABS: ORAL_TABLET | ORAL | 6 refills | Status: AC

## 2023-01-08 MED ORDER — DICLOFENAC SODIUM 75 MG PO TBEC: 75.0000 mg | DELAYED_RELEASE_TABLET | Freq: Two times a day (BID) | ORAL | 3 refills | Status: DC

## 2023-01-08 MED ORDER — ATORVASTATIN CALCIUM 20 MG PO TABS: 20.0000 mg | ORAL_TABLET | Freq: Every day | ORAL | 1 refills | Status: DC

## 2023-01-08 MED ORDER — LISINOPRIL 5 MG PO TABS: 5.0000 mg | ORAL_TABLET | Freq: Every day | ORAL | 1 refills | Status: DC

## 2023-01-08 MED ORDER — SERTRALINE HCL 50 MG PO TABS: 50.0000 mg | ORAL_TABLET | Freq: Every day | ORAL | 3 refills | Status: DC

## 2023-01-08 NOTE — Progress Notes (Signed)
Subjective:    Patient ID: Johnathan Bay., male    DOB: [birth year], 47 y.o.   MRN: [id number]  Hypertension This is a chronic problem. Treatments tried: lisinopril.   Primary hypertension  Obstructive sleep apnea syndrome  Dyslipidemia  Morbid obesity (HCC)  Outpatient Encounter Medications as of 01/08/2023  Medication Sig   albuterol (VENTOLIN HFA) 108 (90 Base) MCG/ACT inhaler Inhale 2 puffs every 4 hours as needed for wheezing   Multiple Vitamin (MULTIVITAMIN PO) Take by mouth at bedtime.   sertraline (ZOLOFT) 50 MG tablet Take 1 tablet (50 mg total) by mouth daily.   [DISCONTINUED] atorvastatin (LIPITOR) 20 MG tablet Take 1 tablet (20 mg total) by mouth daily.   [DISCONTINUED] diclofenac (VOLTAREN) 75 MG EC tablet Take 1 tablet (75 mg total) by mouth 2 (two) times daily.   [DISCONTINUED] lisinopril (ZESTRIL) 5 MG tablet Take 1 tablet (5 mg total) by mouth daily.   [DISCONTINUED] sildenafil (VIAGRA) 100 MG tablet Take 1/2-1 tablet po one hour before relations. No more than one per day.   [DISCONTINUED] traZODone (DESYREL) 50 MG tablet Take 0.5-1 tablets (25-50 mg total) by mouth at bedtime as needed for sleep.   atorvastatin (LIPITOR) 20 MG tablet Take 1 tablet (20 mg total) by mouth daily.   diclofenac (VOLTAREN) 75 MG EC tablet Take 1 tablet (75 mg total) by mouth 2 (two) times daily.   lisinopril (ZESTRIL) 5 MG tablet Take 1 tablet (5 mg total) by mouth daily.   sildenafil (VIAGRA) 100 MG tablet Take 1/2-1 tablet po one hour before relations. No more than one per day.   [DISCONTINUED] doxycycline (VIBRA-TABS) 100 MG tablet Take 1 tablet (100 mg total) by mouth 2 (two) times daily.   No facility-administered encounter medications on file as of 01/08/2023.    Patient for blood pressure check up.  The patient does have hypertension.   Patient relates dietary measures try to minimize salt The importance of healthy diet and activity were discussed Patient relates  compliance  The patient's BMI is calculated.  The patient does have obesity.  The patient does try to some degree staying active and watching diet.  It is in the vital signs and acknowledged.  It is above the recommended BMI for the patient's height and weight.  The patient has been counseled regarding healthy diet, restricted portions, avoiding excessive carbohydrates/sugary foods, and increase physical activity as health permits.  It is in the patient's best interest to lower the risk of secondary illness including heart disease strokes and cancer by losing weight.  The patient acknowledges this information.  Patient here for follow-up regarding cholesterol.    Patient relates taking medication on a regular basis Denies problems with medication Importance of dietary measures discussed Regular lab work regarding lipid and liver was checked and if needing additional labs was appropriately ordered  Patient has sleep apnea he has tried multiple times with machine only to get the CPAP under good control but has been unsuccessful at doing so because of this he finds significant daytime fatigue tiredness sleepiness that just does not get better with slower activity.   Review of Systems     Objective:   Physical Exam General-in no acute distress Eyes-no discharge Lungs-respiratory rate normal, CTA CV-no murmurs,RRR Extremities skin warm dry no edema Neuro grossly normal Behavior normal, alert        Assessment & Plan:  1. Primary hypertension HTN- patient seen for follow-up regarding HTN.   Diet, medication compliance, appropriate  labs and refills were completed.   Importance of keeping blood pressure under good control to lessen the risk of complications discussed Regular follow-up visits discussed   2. Obstructive sleep apnea syndrome Patient unfortunately does not get the ability to utilize his CPAP machine on a regular basis because he does not tolerate it Therefore patient is  interested with [facility] but currently he is too heavy to have this completed  3. Dyslipidemia Utilize medicines as directed Healthy diet  4. Morbid obesity (HCC) Portion control regular physical activity in the past he was on GLP-1's but those are no longer covered  Await to see what lab work shows

## 2023-01-09 LAB — HEPATIC FUNCTION PANEL
ALT: 27 IU/L (ref 0–44)
AST: 17 IU/L (ref 0–40)
Albumin: 4.4 g/dL (ref 4.1–5.1)
Alkaline Phosphatase: 132 IU/L — ABNORMAL HIGH (ref 44–121)
Bilirubin Total: 0.4 mg/dL (ref 0.0–1.2)
Bilirubin, Direct: 0.16 mg/dL (ref 0.00–0.40)
Total Protein: 6.6 g/dL (ref 6.0–8.5)

## 2023-01-09 LAB — BASIC METABOLIC PANEL
BUN/Creatinine Ratio: 7 — ABNORMAL LOW (ref 9–20)
BUN: 6 mg/dL (ref 6–24)
CO2: 24 mmol/L (ref 20–29)
Calcium: 9.7 mg/dL (ref 8.7–10.2)
Chloride: 104 mmol/L (ref 96–106)
Creatinine, Ser: 0.82 mg/dL (ref 0.76–1.27)
Glucose: 121 mg/dL — ABNORMAL HIGH (ref 70–99)
Potassium: 4.5 mmol/L (ref 3.5–5.2)
Sodium: 140 mmol/L (ref 134–144)
eGFR: 108 mL/min/{1.73_m2} (ref >59)

## 2023-01-09 LAB — HEMOGLOBIN A1C
Est. average glucose Bld gHb Est-mCnc: 123 mg/dL
Hgb A1c MFr Bld: 5.9 % — ABNORMAL HIGH (ref 4.8–5.6)

## 2023-01-09 LAB — LIPID PANEL
Chol/HDL Ratio: 3.5 ratio (ref 0.0–5.0)
Cholesterol, Total: 136 mg/dL (ref 100–199)
HDL: 39 mg/dL — ABNORMAL LOW (ref >39)
LDL Chol Calc (NIH): 75 mg/dL (ref 0–99)
Triglycerides: 122 mg/dL (ref 0–149)
VLDL Cholesterol Cal: 22 mg/dL (ref 5–40)

## 2023-01-10 ENCOUNTER — Encounter: Payer: Self-pay | Admitting: Family Medicine

## 2023-01-29 ENCOUNTER — Encounter: Payer: Self-pay | Admitting: Family Medicine

## 2023-01-29 NOTE — Telephone Encounter (Signed)
Nurses Very difficult to know for certain what is causing abdominal pain Although it could be diverticulitis it could also be other things We could see him late Tuesday afternoon with an open slot  Unfortunately today schedule is full-if his situation is severe urgent care would be a good alternative Otherwise later tomorrow afternoon thank you

## 2023-01-29 NOTE — Telephone Encounter (Signed)
Nurses please see previous message

## 2023-01-30 ENCOUNTER — Other Ambulatory Visit (HOSPITAL_COMMUNITY)
Admission: RE | Admit: 2023-01-30 | Discharge: 2023-01-30 | Disposition: A | Discharge status: Home / Self Care | Payer: BC Managed Care – PPO | Source: Ambulatory Visit | Attending: Family Medicine | Admitting: Family Medicine

## 2023-01-30 ENCOUNTER — Telehealth: Payer: Self-pay | Admitting: Family Medicine

## 2023-01-30 ENCOUNTER — Ambulatory Visit: Payer: BC Managed Care – PPO | Admitting: Family Medicine

## 2023-01-30 ENCOUNTER — Encounter: Payer: Self-pay | Admitting: Family Medicine

## 2023-01-30 VITALS — Ht 71.0 in | Wt 256.0 lb

## 2023-01-30 DIAGNOSIS — R1030 Lower abdominal pain, unspecified: Secondary | ICD-10-CM

## 2023-01-30 DIAGNOSIS — D72829 Elevated white blood cell count, unspecified: Secondary | ICD-10-CM

## 2023-01-30 DIAGNOSIS — R1031 Right lower quadrant pain: Secondary | ICD-10-CM | POA: Diagnosis not present

## 2023-01-30 LAB — CBC WITH DIFFERENTIAL/PLATELET
Abs Immature Granulocytes: 0.08 10*3/uL — ABNORMAL HIGH (ref 0.00–0.07)
Basophils Absolute: 0 10*3/uL (ref 0.0–0.1)
Basophils Relative: 0 %
Eosinophils Absolute: 0.2 10*3/uL (ref 0.0–0.5)
Eosinophils Relative: 2 %
HCT: 43.7 % (ref 39.0–52.0)
Hemoglobin: 14.9 g/dL (ref 13.0–17.0)
Immature Granulocytes: 1 %
Lymphocytes Relative: 30 %
Lymphs Abs: 3.4 10*3/uL (ref 0.7–4.0)
MCH: 29.1 pg (ref 26.0–34.0)
MCHC: 34.1 g/dL (ref 30.0–36.0)
MCV: 85.4 fL (ref 80.0–100.0)
Monocytes Absolute: 0.8 10*3/uL (ref 0.1–1.0)
Monocytes Relative: 7 %
Neutro Abs: 6.8 10*3/uL (ref 1.7–7.7)
Neutrophils Relative %: 60 %
Platelets: 341 10*3/uL (ref 150–400)
RBC: 5.12 MIL/uL (ref 4.22–5.81)
RDW: 12.7 % (ref 11.5–15.5)
WBC: 11.3 10*3/uL — ABNORMAL HIGH (ref 4.0–10.5)
nRBC: 0 % (ref 0.0–0.2)

## 2023-01-30 LAB — BASIC METABOLIC PANEL
Anion gap: 10 (ref 5–15)
BUN: 12 mg/dL (ref 6–20)
CO2: 26 mmol/L (ref 22–32)
Calcium: 9.1 mg/dL (ref 8.9–10.3)
Chloride: 99 mmol/L (ref 98–111)
Creatinine, Ser: 0.88 mg/dL (ref 0.61–1.24)
GFR, Estimated: >60 mL/min (ref >60)
Glucose, Bld: 87 mg/dL (ref 70–99)
Potassium: 3.9 mmol/L (ref 3.5–5.1)
Sodium: 135 mmol/L (ref 135–145)

## 2023-01-30 LAB — HEPATIC FUNCTION PANEL
ALT: 27 U/L (ref 0–44)
AST: 19 U/L (ref 15–41)
Albumin: 4 g/dL (ref 3.5–5.0)
Alkaline Phosphatase: 116 U/L (ref 38–126)
Bilirubin, Direct: 0.1 mg/dL (ref 0.0–0.2)
Indirect Bilirubin: 0.5 mg/dL (ref 0.3–0.9)
Total Bilirubin: 0.6 mg/dL (ref 0.3–1.2)
Total Protein: 7.4 g/dL (ref 6.5–8.1)

## 2023-01-30 LAB — LIPASE, BLOOD: Lipase: 40 U/L (ref 11–51)

## 2023-01-30 MED ORDER — METRONIDAZOLE 500 MG PO TABS: 500.0000 mg | ORAL_TABLET | Freq: Three times a day (TID) | ORAL | 0 refills | Status: AC

## 2023-01-30 MED ORDER — CIPROFLOXACIN HCL 500 MG PO TABS
500.0000 mg | ORAL_TABLET | Freq: Two times a day (BID) | ORAL | 0 refills | Status: DC
Start: 2023-01-30 — End: 2023-05-05

## 2023-01-30 NOTE — Telephone Encounter (Signed)
Front/nurses-I did discuss his results with him  We need to recheck him Thursday morning I told him to come into the office at 8 AM and I will work him in as the first or second patient of the morning  Please be expecting him Thursday morning thank you-Dr. Harle Stanford is aware of this

## 2023-01-30 NOTE — Progress Notes (Signed)
Subjective:    Patient ID: Johnathan Parsons., male    DOB: 02/10/1976, 47 y.o.   MRN: [id number]  HPI Abdominal pain with leaning back and forward  Going on for about 5 days - reports having solid BM in the mornings and loose throughout the day . No nausea or vomiting He relates pain discomfort in the abdomen lower abdomen denies high fever chills sweats denies wheezing difficulty breathing Denies dysuria rectal bleeding Review of Systems     Objective:   Physical Exam  General-in no acute distress Eyes-no discharge Lungs-respiratory rate normal, CTA CV-no murmurs,RRR Extremities skin warm dry no edema Neuro grossly normal Behavior normal, alert Abdomen is soft with moderate tenderness in the lower mid abdomen left lower abdomen and right lower abdomen  Lab work was ordered Results for orders placed or performed in visit on 08/28/22  Hemoglobin A1c  Result Value Ref Range   Hgb A1c MFr Bld 5.9 (H) 4.8 - 5.6 %   Est. average glucose Bld gHb Est-mCnc 123 mg/dL  Lipid panel  Result Value Ref Range   Cholesterol, Total 136 100 - 199 mg/dL   Triglycerides 409 0 - 149 mg/dL   HDL 39 (L) >81 mg/dL   VLDL Cholesterol Cal 22 5 - 40 mg/dL   LDL Chol Calc (NIH) 75 0 - 99 mg/dL   Chol/HDL Ratio 3.5 0.0 - 5.0 ratio  Hepatic function panel  Result Value Ref Range   Total Protein 6.6 6.0 - 8.5 g/dL   Albumin 4.4 4.1 - 5.1 g/dL   Bilirubin Total 0.4 0.0 - 1.2 mg/dL   Bilirubin, Direct 1.91 0.00 - 0.40 mg/dL   Alkaline Phosphatase 132 (H) 44 - 121 IU/L   AST 17 0 - 40 IU/L   ALT 27 0 - 44 IU/L  Basic metabolic panel  Result Value Ref Range   Glucose 121 (H) 70 - 99 mg/dL   BUN 6 6 - 24 mg/dL   Creatinine, Ser 4.78 0.76 - 1.27 mg/dL   eGFR 295 >62 ZH/YQM/5.78   BUN/Creatinine Ratio 7 (L) 9 - 20   Sodium 140 134 - 144 mmol/L   Potassium 4.5 3.5 - 5.2 mmol/L   Chloride 104 96 - 106 mmol/L   CO2 24 20 - 29 mmol/L   Calcium 9.7 8.7 - 10.2 mg/dL   Lab work does show elevated  white count      Assessment & Plan:  1. Lower abdominal pain Patient with significant lower abdominal pain and discomfort although the pain is in all 3 areas he has had diverticula on previous CAT scan which raises the possibility of diverticulitis therefore we will go ahead with lab work as well as antibiotics. - CBC with Differential - Basic Metabolic Panel - Hepatic Function Panel - Lipase  2. Leukocytosis, unspecified type White blood count came back slightly elevated I spoke with the patient on Tuesday evening.  We were going to recheck him on Thursday morning.  3. RLQ abdominal pain I reached out to the patient Wednesday afternoon and talked with him he states the pain was much better but now the pain is centralized in the right lower quadrant.  Given the fact that this pain started last week was very severe now improving but still quite prominent and it is localized in the right lower quadrant it is necessary to rule out appendicitis.  Given his body habitus ultrasound would not be adequate.  Therefore we will move forward with doing CT scan  of the abdominal pelvis with contrast stat-patient is aware of this and we will move forward to getting insurance company on board with this to avoid having to send him to the emergency department Obviously if the scan does show appendicitis we will have to send him to the ER for further evaluation and surgical evaluation  Should be noted that I did speak with the patient by the phone and he was on board with doing the scan

## 2023-01-31 ENCOUNTER — Telehealth: Payer: Self-pay | Admitting: Family Medicine

## 2023-01-31 ENCOUNTER — Ambulatory Visit (HOSPITAL_COMMUNITY)
Admission: RE | Admit: 2023-01-31 | Discharge: 2023-01-31 | Disposition: A | Discharge status: Home / Self Care | Payer: Commercial Managed Care - PPO | Source: Ambulatory Visit | Attending: Family Medicine | Admitting: Family Medicine

## 2023-01-31 DIAGNOSIS — R1031 Right lower quadrant pain: Secondary | ICD-10-CM | POA: Diagnosis not present

## 2023-01-31 DIAGNOSIS — D72828 Other elevated white blood cell count: Secondary | ICD-10-CM | POA: Insufficient documentation

## 2023-01-31 MED ORDER — IOHEXOL 300 MG/ML  SOLN
100.0000 mL | Freq: Once | INTRAMUSCULAR | Status: AC | PRN
  Administered 2023-01-31: 100 mL via INTRAVENOUS

## 2023-01-31 NOTE — Telephone Encounter (Signed)
CT order in [facility] and referral coordinator notified.

## 2023-01-31 NOTE — Telephone Encounter (Signed)
Please see most recent documentation from yesterday's office visit  Patient needs CT abdomen pelvis with contrast This needs to be stat Radiology states that we can go ahead and have the patient come there This needs insurance company approval ASAP so they can go ahead with the scan to rule out appendicitis  Reason for the test is 5 days of right lower quadrant abdominal pain along with elevated white blood count

## 2023-02-01 ENCOUNTER — Ambulatory Visit: Payer: BC Managed Care – PPO | Admitting: Family Medicine

## 2023-02-08 ENCOUNTER — Ambulatory Visit (INDEPENDENT_AMBULATORY_CARE_PROVIDER_SITE_OTHER): Payer: Commercial Managed Care - PPO | Admitting: Family Medicine

## 2023-02-08 ENCOUNTER — Encounter: Payer: Self-pay | Admitting: Family Medicine

## 2023-02-08 ENCOUNTER — Encounter: Payer: Commercial Managed Care - PPO | Admitting: Family Medicine

## 2023-02-08 VITALS — BP 122/78 | HR 79 | Wt 254.6 lb

## 2023-02-08 DIAGNOSIS — K5792 Diverticulitis of intestine, part unspecified, without perforation or abscess without bleeding: Secondary | ICD-10-CM | POA: Diagnosis not present

## 2023-02-08 MED ORDER — AMOXICILLIN-POT CLAVULANATE 875-125 MG PO TABS
1.0000 | ORAL_TABLET | Freq: Two times a day (BID) | ORAL | 0 refills | Status: DC
Start: 2023-02-08 — End: 2023-05-05

## 2023-02-08 MED ORDER — FAMOTIDINE 40 MG PO TABS: 40.0000 mg | ORAL_TABLET | Freq: Every day | ORAL | 2 refills | Status: AC

## 2023-02-08 NOTE — Progress Notes (Signed)
   Subjective:    Patient ID: Johnathan Bay., male    DOB: 1976-08-14, 47 y.o.   MRN: [id number]  HPI Patient arrives today for follow up on test results.  Here today for follow-up regarding diverticulitis States feels some pressure in that area but no pain or tenderness or setbacks that he can see  Review of Systems     Objective:   Physical Exam  General-in no acute distress Eyes-no discharge Lungs-respiratory rate normal, CTA CV-no murmurs,RRR Extremities skin warm dry no edema Neuro grossly normal Behavior normal, alert Abdomen is soft with some soreness in the lower abdomen and right lower abdomen no guarding or rebound      Assessment & Plan:   Diverticula this Switch over to Augmentin for 7 days If persistent pain follow-up If worsening pain go to ER If reoccurring troubles or if not totally cleared up in the next 2 to 3 weeks please recheck May need referral to general surgery if worsening or persistent Not toxic currently doubt abscess

## 2023-02-08 NOTE — Progress Notes (Signed)
   Subjective:    Patient ID: Johnathan Parsons., male    DOB: Mar 30, 1976, 47 y.o.   MRN: [id number]  HPI    Review of Systems     Objective:   Physical Exam        Assessment & Plan:   This encounter was created in error - please disregard.

## 2023-02-26 ENCOUNTER — Ambulatory Visit: Payer: Commercial Managed Care - PPO | Admitting: Family Medicine

## 2023-03-02 ENCOUNTER — Encounter: Payer: Self-pay | Admitting: Family Medicine

## 2023-03-05 ENCOUNTER — Other Ambulatory Visit: Payer: Self-pay

## 2023-04-12 ENCOUNTER — Encounter: Payer: Self-pay | Admitting: Family Medicine

## 2023-04-13 NOTE — Telephone Encounter (Signed)
Nurses I read over Johnathan Parsons's message You may share my reply   #1 I would recommend a follow-up office visit.  With myself.  We can discuss potentially prescribing medication to take in the morning to try to help keep awake during the day 2.  100% agree not to drive if feeling drowsy or sleepy 3.  BMI qualifications for inspire now goes all the way up to 40.  If he is interested with that as being a procedure to help him I would recommend a referral to [facility] health ENT Dr. Irene Pap  I am not sure how quickly the surgery could come about if you still would like to do the office visit to utilize medication during the day that would be fine but ultimately doing the surgery would be the next step that would truly help his situation

## 2023-04-26 ENCOUNTER — Other Ambulatory Visit: Payer: Self-pay | Admitting: Family Medicine

## 2023-04-27 ENCOUNTER — Other Ambulatory Visit: Payer: Self-pay | Admitting: Family Medicine

## 2023-05-04 ENCOUNTER — Ambulatory Visit (INDEPENDENT_AMBULATORY_CARE_PROVIDER_SITE_OTHER): Payer: Commercial Managed Care - PPO | Admitting: Nurse Practitioner

## 2023-05-04 VITALS — BP 134/78 | HR 78 | Temp 97.9°F | Ht 71.0 in | Wt 241.0 lb

## 2023-05-04 DIAGNOSIS — G4733 Obstructive sleep apnea (adult) (pediatric): Secondary | ICD-10-CM | POA: Diagnosis not present

## 2023-05-04 NOTE — Progress Notes (Unsigned)
Subjective:    Patient ID: Johnathan Bay., male    DOB: 1975-10-28, 47 y.o.   MRN: [id number]  HPI Pt comes in today to discuss on how to move forward with sleep apnea recommendations. Pt can not do mask as he has tried previously. Over the years has had several sleep studies performed.  Has had multiple types of devices including mask and nasal prongs.  Has had different types of tubing including 1 that went over the top of his head.  Has done CPAP and BiPAP.  Has done variable pressure machines.  Patient has had difficulty with all of these treatments and devices, states he rolls from side-to-side and the mask will get dislodged.  Would like to be referred to discuss options.  Concerned because he is staying sleepy all the time and would like to get adequate treatment due to safety concerns.  Daily vaping which contains some nicotine.  No cigarette use.  Review of Systems  Constitutional:  Positive for fatigue. Negative for fever.  HENT:  Positive for congestion and postnasal drip. Negative for ear pain and sore throat.   Respiratory:  Negative for cough, chest tightness, shortness of breath and wheezing.   Cardiovascular:  Negative for chest pain.      05/04/2023    8:34 AM  Depression screen PHQ 2/9  Decreased Interest 0  Down, Depressed, Hopeless 0  PHQ - 2 Score 0  Altered sleeping 0  Tired, decreased energy 2  Change in appetite 0  Feeling bad or failure about yourself  0  Trouble concentrating 0  Moving slowly or fidgety/restless 0  Suicidal thoughts 0  PHQ-9 Score 2  Difficult doing work/chores Somewhat difficult      05/04/2023    8:34 AM 02/08/2023    8:35 AM 01/30/2023    4:32 PM 01/08/2023    8:28 AM  GAD 7 : Generalized Anxiety Score  Nervous, Anxious, on Edge 0 0 1 1  Control/stop worrying 0 0 1 1  Worry too much - different things 0 0 1 1  Trouble relaxing 0 0 1 1  Restless 0 0 1 2  Easily annoyed or irritable 0 0 1 2  Afraid - awful might happen 0 0 1 0   Total GAD 7 Score 0 0 7 8  Anxiety Difficulty   Somewhat difficult Somewhat difficult         Objective:   Physical Exam NAD.  Alert, oriented.  Very fatigued in appearance.  Making good eye contact.  Speech clear.  TMs retracted bilaterally with mild injection.  Pharynx clear.  Neck supple with mild soft anterior adenopathy.  Lungs clear.  Heart regular rate rhythm. Today's Vitals   05/04/23 0827  BP: 134/78  Pulse: 78  Temp: 97.9 F (36.6 C)  SpO2: 95%  Weight: 241 lb (109.3 kg)  Height: 5\' 11"  (1.803 m)   Body mass index is 33.61 kg/m.        Assessment & Plan:   Problem List Items Addressed This Visit       Respiratory   Obstructive sleep apnea syndrome - Primary   Relevant Orders   Ambulatory referral to Pulmonology   Referral to local pulmonology office for second opinion and discuss options.  Patient to contact our office if he needs further assistance. Defers flu shot today. Discussed importance of limiting vaping is much as possible. Discussed using extreme caution with driving or being around equipment due to his fatigue. Return if  symptoms worsen or fail to improve.

## 2023-05-05 ENCOUNTER — Encounter: Payer: Self-pay | Admitting: Nurse Practitioner

## 2023-05-07 NOTE — Telephone Encounter (Signed)
Nurses In the previous communication I recommended ENT for evaluation  Inadvertently he has been referred to pulmonary. Pulmonary evaluation needs to be canceled  Please refer him to [facility] for evaluation of Inspire If they do not do this procedure then [facility] does  If he adds is not any other needs we will be more than happy to do an office visit to discuss any other issues

## 2023-05-08 ENCOUNTER — Other Ambulatory Visit: Payer: Self-pay

## 2023-05-08 DIAGNOSIS — R069 Unspecified abnormalities of breathing: Secondary | ICD-10-CM

## 2023-05-16 ENCOUNTER — Encounter: Payer: Self-pay | Admitting: Family Medicine

## 2023-05-16 NOTE — Telephone Encounter (Signed)
Nurses Please connect with the referral specialist to help with the insurance referrals.  He needs a specific ENT that does inspire. Dr.Bates with [facility] does do this Also Dr.Soldatova with [facility] also does this procedure thank you  Please cancel the referral to [facility] please also make sure you keep Jr in the loop When scheduling with these offices please verify that they do the evaluation for this as well as capable of utilizing INSPIRE (this is a implantable stimulator within the larynx region that treats CPAP) Thank you-Dr. Lorin Picket

## 2023-05-17 NOTE — Telephone Encounter (Signed)
Nurses Please reach out to the patient If blood pressure readings or that low I would recommend holding off on the lisinopril If this was a one-time occurrence there may have been something going on that triggered it If this is an ongoing occurrence then his medications will need to be adjusted Either way he would be wise to get checked out if he is feeling back to normal he needs to send [place] an update if he is having problems we could work him in at the end of today at 4 PM or he could go to emergency department ER depending on how bad he is feeling thank you

## 2023-05-18 ENCOUNTER — Ambulatory Visit
Admission: EM | Admit: 2023-05-18 | Discharge: 2023-05-18 | Disposition: A | Discharge status: Home / Self Care | Payer: Commercial Managed Care - PPO | Attending: Family Medicine | Admitting: Family Medicine

## 2023-05-18 DIAGNOSIS — J069 Acute upper respiratory infection, unspecified: Secondary | ICD-10-CM

## 2023-05-18 DIAGNOSIS — Z1152 Encounter for screening for COVID-19: Secondary | ICD-10-CM | POA: Diagnosis not present

## 2023-05-18 DIAGNOSIS — R0981 Nasal congestion: Secondary | ICD-10-CM | POA: Insufficient documentation

## 2023-05-18 DIAGNOSIS — R052 Subacute cough: Secondary | ICD-10-CM | POA: Diagnosis present

## 2023-05-18 DIAGNOSIS — R059 Cough, unspecified: Secondary | ICD-10-CM | POA: Insufficient documentation

## 2023-05-18 MED ORDER — PROMETHAZINE-DM 6.25-15 MG/5ML PO SYRP: 5.0000 mL | ORAL_SOLUTION | Freq: Four times a day (QID) | ORAL | 0 refills | Status: DC | PRN

## 2023-05-18 MED ORDER — LIDOCAINE VISCOUS HCL 2 % MT SOLN: 10.0000 mL | OROMUCOSAL | 0 refills | Status: AC | PRN

## 2023-05-18 NOTE — ED Provider Notes (Signed)
[facility]    CSN: [id number] Arrival date & time: 05/18/23  1122      History   Chief Complaint No chief complaint on file.   HPI [name]. is a 47 y.o. male.   Patient presenting today with 2-day history of cough, congestion, sore throat, body aches, congestion, fatigue, decreased appetite, headache.  Denies chest pain, shortness of breath, vomiting, diarrhea, rashes.  So far not trying anything over-the-counter for symptoms.  No known sick contacts recently.    Past Medical History:  Diagnosis Date   Hepatic steatosis    Hypertension    Kidney stones 2009   Ventral hernia     Patient Active Problem List   Diagnosis Date Noted   Cough 05/18/2023   Nasal congestion 05/18/2023   Morbid obesity (HCC) 03/06/2022   Fatty liver 03/06/2022   Hyperlipidemia 03/14/2018   Obstructive sleep apnea syndrome 03/11/2018   Dyslipidemia 10/20/2015   HTN (hypertension) 06/11/2014   Tobacco use disorder 06/11/2014    Past Surgical History:  Procedure Laterality Date   VASECTOMY     WISDOM TOOTH EXTRACTION         Home Medications    Prior to Admission medications   Medication Sig Start Date End Date Taking? Authorizing Provider  lidocaine (XYLOCAINE) 2 % solution Use as directed 10 mLs in the mouth or throat every 3 (three) hours as needed. 05/18/23  Yes Particia Nearing, PA-C  promethazine-dextromethorphan (PROMETHAZINE-DM) 6.25-15 MG/5ML syrup Take 5 mLs by mouth 4 (four) times daily as needed. 05/18/23  Yes Particia Nearing, PA-C  albuterol (VENTOLIN HFA) 108 (90 Base) MCG/ACT inhaler Inhale 2 puffs every 4 hours as needed for wheezing 08/28/22   Babs Sciara, MD  atorvastatin (LIPITOR) 20 MG tablet Take 1 tablet (20 mg total) by mouth daily. 01/08/23   Babs Sciara, MD  diclofenac (VOLTAREN) 75 MG EC tablet Take 1 tablet by mouth twice daily 04/26/23   Babs Sciara, MD  famotidine (PEPCID) 40 MG tablet Take 1 tablet (40 mg total) by  mouth daily. 02/08/23   Babs Sciara, MD  lisinopril (ZESTRIL) 5 MG tablet Take 1 tablet (5 mg total) by mouth daily. 01/08/23   Babs Sciara, MD  Multiple Vitamin (MULTIVITAMIN PO) Take by mouth at bedtime.    [provider]  sertraline (ZOLOFT) 50 MG tablet TAKE 1 TABLET BY MOUTH ONCE DAILY 04/29/23   Babs Sciara, MD  sildenafil (VIAGRA) 100 MG tablet Take 1/2-1 tablet po one hour before relations. No more than one per day. 01/08/23   Babs Sciara, MD    Family History Family History  Problem Relation Age of Onset   Hypertension Father    Diabetes Father    Heart disease Father    Colon polyps Father    Liver disease Father    Sleep apnea Father    Hypertension Maternal Grandmother    Heart disease Maternal Grandmother    Liver cancer Maternal Grandmother    Kidney disease Maternal Grandmother    Sleep apnea Maternal Grandmother    Hypertension Maternal Grandfather    Prostate cancer Maternal Grandfather    Colon cancer Maternal Grandfather    Hypertension Paternal Grandmother    Hypertension Paternal Grandfather    Pancreatic cancer Paternal Grandfather    Gallbladder disease Neg Hx    Esophageal cancer Neg Hx     Social History Social History   Tobacco Use   Smoking status:  Every Day    Types: E-cigarettes   Smokeless tobacco: Never  Vaping Use   Vaping status: Every Day   Start date: 09/17/2017   Devices: juul  Substance Use Topics   Alcohol use: Not Currently    Alcohol/week: 1.0 standard drink of alcohol    Types: 1 Glasses of wine per week   Drug use: No     Allergies   Oxycodone-acetaminophen and Sulfa antibiotics   Review of Systems Review of Systems Per HPI  Physical Exam Triage Vital Signs ED Triage Vitals  Encounter Vitals Group     BP 05/18/23 1144 105/73     Systolic BP Percentile --      Diastolic BP Percentile --      Pulse Rate 05/18/23 1144 90     Resp 05/18/23 1144 18     Temp 05/18/23 1144 99.8 F (37.7 C)      Temp Source 05/18/23 1144 Oral     SpO2 05/18/23 1144 95 %     Weight --      Height --      Head Circumference --      Peak Flow --      Pain Score 05/18/23 1145 5     Pain Loc --      Pain Education --      Exclude from Growth Chart --    No data found.  Updated Vital Signs BP 105/73   Pulse 90   Temp 99.8 F (37.7 C) (Oral)   Resp 18   SpO2 95%   Visual Acuity Right Eye Distance:   Left Eye Distance:   Bilateral Distance:    Right Eye Near:   Left Eye Near:    Bilateral Near:     Physical Exam Vitals and nursing note reviewed.  Constitutional:      Appearance: He is well-developed.  HENT:     Head: Atraumatic.     Right Ear: External ear normal.     Left Ear: External ear normal.     Nose: Rhinorrhea present.     Mouth/Throat:     Pharynx: Posterior oropharyngeal erythema present. No oropharyngeal exudate.  Eyes:     Conjunctiva/sclera: Conjunctivae normal.     Pupils: Pupils are equal, round, and reactive to light.  Cardiovascular:     Rate and Rhythm: Normal rate and regular rhythm.  Pulmonary:     Effort: Pulmonary effort is normal. No respiratory distress.     Breath sounds: No wheezing or rales.  Musculoskeletal:        General: Normal range of motion.     Cervical back: Normal range of motion and neck supple.  Lymphadenopathy:     Cervical: No cervical adenopathy.  Skin:    General: Skin is warm and dry.  Neurological:     Mental Status: He is alert and oriented to person, place, and time.  Psychiatric:        Behavior: Behavior normal.      UC Treatments / Results  Labs (all labs ordered are listed, but only abnormal results are displayed) Labs Reviewed  SARS CORONAVIRUS 2 (TAT 6-24 HRS)    EKG   Radiology No results found.  Procedures Procedures (including critical care time)  Medications Ordered in UC Medications - No data to display  Initial Impression / Assessment and Plan / UC Course  I have reviewed the triage vital  signs and the nursing notes.  Pertinent labs & imaging results that were available  during my care of the patient were reviewed by me and considered in my medical decision making (see chart for details).     Vitals and exam reassuring and suspicious for viral respiratory infection.  COVID testing pending, treat with Phenergan DM, viscous lidocaine, supportive over-the-counter medications and home care.  Good candidate for molnupiravir if positive for COVID.  Final Clinical Impressions(s) / UC Diagnoses   Final diagnoses:  Viral URI with cough     Discharge Instructions      You may take over-the-counter cold and congestion medications, ibuprofen and Tylenol as needed for the body aches and have sent over a numbing liquid to gargle for your sore throat and a cough syrup.  Your COVID test should be back tomorrow and someone will call if positive.    ED Prescriptions     Medication Sig Dispense Auth. Provider   promethazine-dextromethorphan (PROMETHAZINE-DM) 6.25-15 MG/5ML syrup Take 5 mLs by mouth 4 (four) times daily as needed. 100 mL Particia Nearing, PA-C   lidocaine (XYLOCAINE) 2 % solution Use as directed 10 mLs in the mouth or throat every 3 (three) hours as needed. 100 mL Particia Nearing, [place]      PDMP not reviewed this encounter.   Particia Nearing, [place] 05/18/23 1202

## 2023-05-18 NOTE — ED Triage Notes (Signed)
Pt reports his pressure has been going a little low, cough, congestion, throat pain, body aches, fatigue, loss of appetite, and headache x 2 days   Nothing taken for relief

## 2023-05-18 NOTE — Discharge Instructions (Addendum)
You may take over-the-counter cold and congestion medications, ibuprofen and Tylenol as needed for the body aches and have sent over a numbing liquid to gargle for your sore throat and a cough syrup.  Your COVID test should be back tomorrow and someone will call if positive.

## 2023-05-19 LAB — SARS CORONAVIRUS 2 (TAT 6-24 HRS): SARS Coronavirus 2: NEGATIVE

## 2023-05-21 NOTE — Telephone Encounter (Signed)
Sick visit scheduled with Dr Adriana Simas

## 2023-05-22 ENCOUNTER — Ambulatory Visit (INDEPENDENT_AMBULATORY_CARE_PROVIDER_SITE_OTHER): Payer: Commercial Managed Care - PPO | Admitting: Family Medicine

## 2023-05-22 VITALS — BP 110/84 | HR 88 | Temp 97.7°F | Wt 231.0 lb

## 2023-05-22 DIAGNOSIS — J988 Other specified respiratory disorders: Secondary | ICD-10-CM

## 2023-05-22 MED ORDER — AMOXICILLIN-POT CLAVULANATE 875-125 MG PO TABS
1.0000 | ORAL_TABLET | Freq: Two times a day (BID) | ORAL | 0 refills | Status: DC
Start: 2023-05-22 — End: 2023-07-12

## 2023-05-22 NOTE — Assessment & Plan Note (Signed)
Given persistent symptoms and lack of improvement, placing on Augmentin.

## 2023-05-22 NOTE — Progress Notes (Signed)
Subjective:  Patient ID: Johnathan Parsons., male    DOB: 1975-12-15  Age: 47 y.o. MRN: [id number]  CC: Respiratory symptoms  HPI:  47 year old male presents for evaluation of respiratory symptoms.  Patient has been sick since Wednesday of last week.  He has had cough, congestion,, body aches, chills.  Low-grade temp, Tmax 100.3.  He was seen in urgent care on Friday.  Negative COVID testing.  He was not tested for influenza.  He continues to be symptomatic and feels poorly.  He has been treated with viscous lidocaine as well as Promethazine DM.  Sore throat has resolved.  Cough continues to persist in addition to his other symptoms.  Patient Active Problem List   Diagnosis Date Noted   Respiratory infection 05/22/2023   Morbid obesity (HCC) 03/06/2022   Fatty liver 03/06/2022   Hyperlipidemia 03/14/2018   Obstructive sleep apnea syndrome 03/11/2018   Dyslipidemia 10/20/2015   HTN (hypertension) 06/11/2014   Tobacco use disorder 06/11/2014    Social Hx   Social History   Socioeconomic History   Marital status: Married    Spouse name: Not on file   Number of children: 5   Years of education: Not on file   Highest education level: Some college, no degree  Occupational History   Occupation: IT consultant  Tobacco Use   Smoking status: Every Day    Types: E-cigarettes   Smokeless tobacco: Never  Vaping Use   Vaping status: Every Day   Start date: 09/17/2017   Devices: juul  Substance and Sexual Activity   Alcohol use: Not Currently    Alcohol/week: 1.0 standard drink of alcohol    Types: 1 Glasses of wine per week   Drug use: No   Sexual activity: Not on file  Other Topics Concern   Not on file  Social History Narrative   Not on file   Social Determinants of Health   Financial Resource Strain: Low Risk  (01/08/2023)   Overall Financial Resource Strain (CARDIA)    Difficulty of Paying Living Expenses: Not hard at all  Food Insecurity: No Food  Insecurity (01/08/2023)   Hunger Vital Sign    Worried About Running Out of Food in the Last Year: Never true    Ran Out of Food in the Last Year: Never true  Transportation Needs: No Transportation Needs (01/08/2023)   PRAPARE - Administrator, Civil Service (Medical): No    Lack of Transportation (Non-Medical): No  Physical Activity: Sufficiently Active (01/08/2023)   Exercise Vital Sign    Days of Exercise per Week: 3 days    Minutes of Exercise per Session: 60 min  Stress: Stress Concern Present (01/08/2023)   [employer] of Occupational Health - Occupational Stress Questionnaire    Feeling of Stress : Very much  Social Connections: Moderately Integrated (01/08/2023)   Social Connection and Isolation Panel [NHANES]    Frequency of Communication with Friends and Family: More than three times a week    Frequency of Social Gatherings with Friends and Family: Once a week    Attends Religious Services: 1 to 4 times per year    Active Member of [employer] or Organizations: No    Attends Engineer, structural: Not on file    Marital Status: Married    Review of Systems Per HPI  Objective:  BP 110/84   Pulse 88   Temp 97.7 F (36.5 C) (Oral)   Wt 231 lb (104.8 kg)  SpO2 97%   BMI 32.22 kg/m      05/22/2023   11:13 AM 05/18/2023   11:44 AM 05/04/2023    8:27 AM  BP/Weight  Systolic BP 110 105 134  Diastolic BP 84 73 78  Wt. (Lbs) 231  241  BMI 32.22 kg/m2  33.61 kg/m2    Physical Exam Vitals and nursing note reviewed.  Constitutional:      General: He is not in acute distress.    Appearance: Normal appearance.  HENT:     Head: Normocephalic and atraumatic.     Right Ear: Tympanic membrane normal.     Left Ear: Tympanic membrane normal.     Mouth/Throat:     Pharynx: Posterior oropharyngeal erythema present.  Cardiovascular:     Rate and Rhythm: Normal rate and regular rhythm.  Pulmonary:     Effort: Pulmonary effort is normal.     Breath  sounds: Normal breath sounds. No wheezing.  Neurological:     Mental Status: He is alert.     Lab Results  Component Value Date   WBC 11.3 (H) 01/30/2023   HGB 14.9 01/30/2023   HCT 43.7 01/30/2023   PLT 341 01/30/2023   GLUCOSE 87 01/30/2023   CHOL 136 01/08/2023   TRIG 122 01/08/2023   HDL 39 (L) 01/08/2023   LDLCALC 75 01/08/2023   ALT 27 01/30/2023   AST 19 01/30/2023   NA 135 01/30/2023   K 3.9 01/30/2023   CL 99 01/30/2023   CREATININE 0.88 01/30/2023   BUN 12 01/30/2023   CO2 26 01/30/2023   HGBA1C 5.9 (H) 01/08/2023     Assessment & Plan:   Problem List Items Addressed This Visit       Respiratory   Respiratory infection - Primary    Given persistent symptoms and lack of improvement, placing on Augmentin.       Meds ordered this encounter  Medications   amoxicillin-clavulanate (AUGMENTIN) 875-125 MG tablet    Sig: Take 1 tablet by mouth 2 (two) times daily.    Dispense:  14 tablet    Refill:  0    Follow-up:  Return if symptoms worsen or fail to improve.  Everlene Other DO [facility]

## 2023-05-22 NOTE — Patient Instructions (Signed)
Antibiotic as prescribed.  If symptoms do not improve or worsen, please let us know.  Take care  Dr. Adriana Simas

## 2023-05-25 ENCOUNTER — Other Ambulatory Visit: Payer: Self-pay | Admitting: Family Medicine

## 2023-05-26 ENCOUNTER — Telehealth: Payer: Self-pay | Admitting: Family Medicine

## 2023-05-26 NOTE — Telephone Encounter (Signed)
Nurses Please see MyChart message I believe that this message is getting convoluted  My understanding is Johnathan Parsons is done by [facility] Dr. As well as Dr. Jenne Pane with [facility] Please send a new MyChart message to the patient clarifying this because I believe the previous thread keeps getting replayed He does not need to go to [facility] Please see my message if you have any questions asked me directly thank you  Nurses Please connect with the referral specialist to help with the insurance referrals.  He needs a specific ENT that does inspire. Dr.Bates with [facility] does do this Also Dr.Soldatova with [facility] also does this procedure thank you   Please cancel the referral to [facility] please also make sure you keep Jr in the loop When scheduling with these offices please verify that they do the evaluation for this as well as capable of utilizing INSPIRE (this is a implantable stimulator within the larynx region that treats CPAP) Thank you-Dr. Lorin Picket

## 2023-05-29 ENCOUNTER — Other Ambulatory Visit: Payer: Self-pay

## 2023-05-29 DIAGNOSIS — G4733 Obstructive sleep apnea (adult) (pediatric): Secondary | ICD-10-CM

## 2023-06-07 ENCOUNTER — Ambulatory Visit: Payer: BC Managed Care – PPO | Admitting: Family Medicine

## 2023-06-21 ENCOUNTER — Institutional Professional Consult (permissible substitution) (HOSPITAL_BASED_OUTPATIENT_CLINIC_OR_DEPARTMENT_OTHER): Payer: Commercial Managed Care - PPO | Admitting: Pulmonary Disease

## 2023-06-26 ENCOUNTER — Ambulatory Visit
Admission: EM | Admit: 2023-06-26 | Discharge: 2023-06-26 | Disposition: A | Discharge status: Home / Self Care | Payer: Commercial Managed Care - PPO | Attending: Nurse Practitioner | Admitting: Nurse Practitioner

## 2023-06-26 DIAGNOSIS — J069 Acute upper respiratory infection, unspecified: Secondary | ICD-10-CM

## 2023-06-26 LAB — POC COVID19/FLU A&B COMBO
Covid Antigen, POC: NEGATIVE
Influenza A Antigen, POC: NEGATIVE
Influenza B Antigen, POC: NEGATIVE

## 2023-06-26 MED ORDER — PROMETHAZINE-DM 6.25-15 MG/5ML PO SYRP: 5.0000 mL | ORAL_SOLUTION | Freq: Four times a day (QID) | ORAL | 0 refills | Status: DC | PRN

## 2023-06-26 MED ORDER — CETIRIZINE HCL 10 MG PO TABS: 10.0000 mg | ORAL_TABLET | Freq: Every day | ORAL | 0 refills | Status: DC

## 2023-06-26 MED ORDER — FLUTICASONE PROPIONATE 50 MCG/ACT NA SUSP: 2.0000 | Freq: Every day | NASAL | 0 refills | Status: AC

## 2023-06-26 NOTE — ED Provider Notes (Signed)
[facility]    CSN: [id number] Arrival date & time: 06/26/23  1045      History   Chief Complaint No chief complaint on file.   HPI Johnathan Parsons. is a 47 y.o. male.   The history is provided by the patient.   Patient presents for complaints of fatigue, body aches, chills, nasal congestion, itchy watery eyes, scratchy throat, chest congestion, and cough.  Symptoms have been present for the past 24 to 48 hours.  Denies headache, ear pain, wheezing, difficulty breathing, chest pain, abdominal pain, nausea, vomiting, or diarrhea.  Patient reports he has been taking over-the-counter DayQuil and NyQuil for his symptoms with minimal relief.  Past Medical History:  Diagnosis Date   Hepatic steatosis    Hypertension    Kidney stones 2009   Ventral hernia     Patient Active Problem List   Diagnosis Date Noted   Respiratory infection 05/22/2023   Morbid obesity (HCC) 03/06/2022   Fatty liver 03/06/2022   Hyperlipidemia 03/14/2018   Obstructive sleep apnea syndrome 03/11/2018   Dyslipidemia 10/20/2015   HTN (hypertension) 06/11/2014   Tobacco use disorder 06/11/2014    Past Surgical History:  Procedure Laterality Date   VASECTOMY     WISDOM TOOTH EXTRACTION         Home Medications    Prior to Admission medications   Medication Sig Start Date End Date Taking? Authorizing Provider  cetirizine (ZYRTEC) 10 MG tablet Take 1 tablet (10 mg total) by mouth daily. 06/26/23  Yes Leath-Warren, Sadie Haber, NP  fluticasone (FLONASE) 50 MCG/ACT nasal spray Place 2 sprays into both nostrils daily. 06/26/23  Yes Leath-Warren, Sadie Haber, NP  promethazine-dextromethorphan (PROMETHAZINE-DM) 6.25-15 MG/5ML syrup Take 5 mLs by mouth 4 (four) times daily as needed. 06/26/23  Yes Leath-Warren, Sadie Haber, NP  albuterol (VENTOLIN HFA) 108 (90 Base) MCG/ACT inhaler Inhale 2 puffs every 4 hours as needed for wheezing 08/28/22   Babs Sciara, MD  amoxicillin-clavulanate  (AUGMENTIN) 875-125 MG tablet Take 1 tablet by mouth 2 (two) times daily. 05/22/23   Tommie Sams, DO  atorvastatin (LIPITOR) 20 MG tablet Take 1 tablet (20 mg total) by mouth daily. 01/08/23   Babs Sciara, MD  diclofenac (VOLTAREN) 75 MG EC tablet Take 1 tablet by mouth twice daily 05/26/23   Babs Sciara, MD  famotidine (PEPCID) 40 MG tablet Take 1 tablet (40 mg total) by mouth daily. 02/08/23   Babs Sciara, MD  lidocaine (XYLOCAINE) 2 % solution Use as directed 10 mLs in the mouth or throat every 3 (three) hours as needed. 05/18/23   Particia Nearing, PA-C  lisinopril (ZESTRIL) 5 MG tablet Take 1 tablet (5 mg total) by mouth daily. 01/08/23   Babs Sciara, MD  Multiple Vitamin (MULTIVITAMIN PO) Take by mouth at bedtime.    [provider]  sertraline (ZOLOFT) 50 MG tablet TAKE 1 TABLET BY MOUTH ONCE DAILY 04/29/23   Babs Sciara, MD  sildenafil (VIAGRA) 100 MG tablet Take 1/2-1 tablet po one hour before relations. No more than one per day. 01/08/23   Babs Sciara, MD    Family History Family History  Problem Relation Age of Onset   Hypertension Father    Diabetes Father    Heart disease Father    Colon polyps Father    Liver disease Father    Sleep apnea Father    Hypertension Maternal Grandmother    Heart disease Maternal Grandmother  Liver cancer Maternal Grandmother    Kidney disease Maternal Grandmother    Sleep apnea Maternal Grandmother    Hypertension Maternal Grandfather    Prostate cancer Maternal Grandfather    Colon cancer Maternal Grandfather    Hypertension Paternal Grandmother    Hypertension Paternal Grandfather    Pancreatic cancer Paternal Grandfather    Gallbladder disease Neg Hx    Esophageal cancer Neg Hx     Social History Social History   Tobacco Use   Smoking status: Every Day    Types: E-cigarettes   Smokeless tobacco: Never  Vaping Use   Vaping status: Every Day   Start date: 09/17/2017   Devices: juul  Substance  Use Topics   Alcohol use: Not Currently    Alcohol/week: 1.0 standard drink of alcohol    Types: 1 Glasses of wine per week   Drug use: No     Allergies   Oxycodone-acetaminophen and Sulfa antibiotics   Review of Systems Review of Systems Per HPI  Physical Exam Triage Vital Signs ED Triage Vitals  Encounter Vitals Group     BP 06/26/23 1051 109/68     Systolic BP Percentile --      Diastolic BP Percentile --      Pulse Rate 06/26/23 1051 (!) 107     Resp 06/26/23 1051 15     Temp 06/26/23 1051 99 F (37.2 C)     Temp Source 06/26/23 1051 Oral     SpO2 06/26/23 1051 94 %     Weight --      Height --      Head Circumference --      Peak Flow --      Pain Score 06/26/23 1054 0     Pain Loc --      Pain Education --      Exclude from Growth Chart --    No data found.  Updated Vital Signs BP 109/68 (BP Location: Right Arm)   Pulse (!) 107   Temp 99 F (37.2 C) (Oral)   Resp 15   SpO2 94%   Visual Acuity Right Eye Distance:   Left Eye Distance:   Bilateral Distance:    Right Eye Near:   Left Eye Near:    Bilateral Near:     Physical Exam Vitals and nursing note reviewed.  Constitutional:      General: He is not in acute distress.    Appearance: Normal appearance.  HENT:     Head: Normocephalic.     Right Ear: Tympanic membrane, ear canal and external ear normal.     Left Ear: Tympanic membrane, ear canal and external ear normal.     Nose: Congestion present.     Right Turbinates: Enlarged and swollen.     Left Turbinates: Enlarged and swollen.     Right Sinus: No maxillary sinus tenderness or frontal sinus tenderness.     Left Sinus: No maxillary sinus tenderness or frontal sinus tenderness.     Mouth/Throat:     Lips: Pink.     Mouth: Mucous membranes are moist.     Pharynx: Uvula midline. Postnasal drip present. No pharyngeal swelling, oropharyngeal exudate, posterior oropharyngeal erythema or uvula swelling.     Comments: Cobblestoning present  to posterior oropharynx  Eyes:     Extraocular Movements: Extraocular movements intact.     Pupils: Pupils are equal, round, and reactive to light.  Cardiovascular:     Rate and Rhythm: Normal rate and  regular rhythm.     Pulses: Normal pulses.     Heart sounds: Normal heart sounds.  Pulmonary:     Effort: Pulmonary effort is normal. No respiratory distress.     Breath sounds: Normal breath sounds. No stridor. No wheezing, rhonchi or rales.  Abdominal:     General: Bowel sounds are normal.     Palpations: Abdomen is soft.     Tenderness: There is no abdominal tenderness.  Musculoskeletal:     Cervical back: Normal range of motion.  Lymphadenopathy:     Cervical: No cervical adenopathy.  Skin:    General: Skin is warm and dry.  Neurological:     General: No focal deficit present.     Mental Status: He is alert and oriented to person, place, and time.  Psychiatric:        Mood and Affect: Mood normal.        Behavior: Behavior normal.      UC Treatments / Results  Labs (all labs ordered are listed, but only abnormal results are displayed) Labs Reviewed  POC COVID19/FLU A&B COMBO    EKG   Radiology No results found.  Procedures Procedures (including critical care time)  Medications Ordered in UC Medications - No data to display  Initial Impression / Assessment and Plan / UC Course  I have reviewed the triage vital signs and the nursing notes.  Pertinent labs & imaging results that were available during my care of the patient were reviewed by me and considered in my medical decision making (see chart for details).  COVID/influenza test was negative.  Suspect patient may have a viral illness.  Will provide symptomatic treatment with cetirizine 10 mg for nasal congestion and postnasal drainage, fluticasone 50 mcg nasal spray for nasal congestion, and Promethazine DM for his cough.  Supportive care recommendations were provided and discussed with the patient to include  over-the-counter Tylenol or ibuprofen, normal saline nasal spray, increasing fluids, and allowing for plenty of rest.  Discussed indications with the patient open follow-up may be necessary.  Patient is in agreement with this plan of care and verbalizes understanding.  All questions were answered.  Patient stable for discharge.  Work note was provided.  Final Clinical Impressions(s) / UC Diagnoses   Final diagnoses:  Viral upper respiratory tract infection with cough     Discharge Instructions      The COVID/influenza test was negative. Take medication as prescribed. Increase fluids and allow for plenty of rest. May take over-the-counter Tylenol or ibuprofen as needed for pain, fever, or general discomfort. Recommend normal saline spray throughout the day to help with nasal congestion and runny nose. For the cough, recommend use of a humidifier in your bedroom at nighttime during sleep and sleeping slightly elevated on pillows while symptoms persist. If symptoms have not improved over the next 7 to 10 days, or if symptoms suddenly worsen, you may follow-up in this clinic or with your primary care physician for further evaluation. Follow-up as needed.     ED Prescriptions     Medication Sig Dispense Auth. Provider   promethazine-dextromethorphan (PROMETHAZINE-DM) 6.25-15 MG/5ML syrup Take 5 mLs by mouth 4 (four) times daily as needed. 118 mL Leath-Warren, Sadie Haber, NP   fluticasone (FLONASE) 50 MCG/ACT nasal spray Place 2 sprays into both nostrils daily. 16 g Leath-Warren, Sadie Haber, NP   cetirizine (ZYRTEC) 10 MG tablet Take 1 tablet (10 mg total) by mouth daily. 30 tablet Leath-Warren, Sadie Haber, NP  PDMP not reviewed this encounter.   Abran Cantor, NP 06/26/23 1149

## 2023-06-26 NOTE — ED Triage Notes (Addendum)
Pt c/o sinus sx's, nasal congestion, chest congestion,cough, itchy watery eyes, scratchy throat, yesterday there was congestion but no other sx's. Pt states this morning he woke up this morning and felt twice as bad.

## 2023-06-26 NOTE — Discharge Instructions (Addendum)
The COVID/influenza test was negative. Take medication as prescribed. Increase fluids and allow for plenty of rest. May take over-the-counter Tylenol or ibuprofen as needed for pain, fever, or general discomfort. Recommend normal saline spray throughout the day to help with nasal congestion and runny nose. For the cough, recommend use of a humidifier in your bedroom at nighttime during sleep and sleeping slightly elevated on pillows while symptoms persist. If symptoms have not improved over the next 7 to 10 days, or if symptoms suddenly worsen, you may follow-up in this clinic or with your primary care physician for further evaluation. Follow-up as needed.

## 2023-07-12 ENCOUNTER — Encounter (INDEPENDENT_AMBULATORY_CARE_PROVIDER_SITE_OTHER): Payer: Self-pay | Admitting: Otolaryngology

## 2023-07-12 ENCOUNTER — Ambulatory Visit (INDEPENDENT_AMBULATORY_CARE_PROVIDER_SITE_OTHER): Payer: Commercial Managed Care - PPO | Admitting: Otolaryngology

## 2023-07-12 VITALS — BP 133/82 | HR 70 | Ht 71.0 in | Wt 216.0 lb

## 2023-07-12 DIAGNOSIS — B9689 Other specified bacterial agents as the cause of diseases classified elsewhere: Secondary | ICD-10-CM

## 2023-07-12 DIAGNOSIS — K219 Gastro-esophageal reflux disease without esophagitis: Secondary | ICD-10-CM

## 2023-07-12 DIAGNOSIS — Z6830 Body mass index (BMI) 30.0-30.9, adult: Secondary | ICD-10-CM | POA: Diagnosis not present

## 2023-07-12 DIAGNOSIS — Z789 Other specified health status: Secondary | ICD-10-CM

## 2023-07-12 DIAGNOSIS — R0981 Nasal congestion: Secondary | ICD-10-CM

## 2023-07-12 DIAGNOSIS — G4733 Obstructive sleep apnea (adult) (pediatric): Secondary | ICD-10-CM

## 2023-07-12 DIAGNOSIS — J019 Acute sinusitis, unspecified: Secondary | ICD-10-CM | POA: Diagnosis not present

## 2023-07-12 MED ORDER — METHYLPREDNISOLONE 4 MG PO TBPK: ORAL_TABLET | ORAL | 1 refills | Status: DC

## 2023-07-12 MED ORDER — DOXYCYCLINE HYCLATE 100 MG PO TABS: 100.0000 mg | ORAL_TABLET | Freq: Two times a day (BID) | ORAL | 0 refills | Status: DC

## 2023-07-12 NOTE — Patient Instructions (Signed)
-   schedule sleep endoscopy

## 2023-07-12 NOTE — Progress Notes (Signed)
SUBJECTIVE:  Chief Complaint: OSA CPAP intolerance  Referring sleep doc: [facility]   Discussed the use of AI scribe software for clinical note transcription with the patient, who gave verbal consent to proceed.  History of Present Illness   The patient, with a history of severe sleep apnea, presents for a consultation regarding the Inspire implant. He has previously undergone a diagnostic sleep study in 2023, which revealed an AHI of 63. Despite initial reluctance, he eventually trialed CPAP and BiPAP therapies. However, he reports significant difficulty in tolerating these treatments due to his tendency to toss and turn during sleep. This restlessness often results in the dislodging of the mask, regardless of whether he uses a nasal, half face, or full face mask.  The patient denies any history of insomnia, heart disease, lung disease, reflux, heartburn, or nasal congestion. He has no current difficulties falling asleep.  The patient also reports a recent upper respiratory infection, which has been ongoing for approximately two weeks. He has been using a nasal spray for symptom management. Allergy to Sulfa.   He is not currently on blood thinners.     Records reviewed:  ED/UC visit for URI 06/26/23  Patient presents for complaints of fatigue, body aches, chills, nasal congestion, itchy watery eyes, scratchy throat, chest congestion, and cough. Symptoms have been present for the past 24 to 48 hours. Denies headache, ear pain, wheezing, difficulty breathing, chest pain, abdominal pain, nausea, vomiting, or diarrhea. Patient reports he has been taking over-the-counter DayQuil and NyQuil for his symptoms with minimal relief.    05/18/23 another UC visit  Patient presenting today with 2-day history of cough, congestion, sore throat, body aches, congestion, fatigue, decreased appetite, headache. Denies chest pain, shortness of breath, vomiting, diarrhea, rashes. So far not trying anything  over-the-counter for symptoms. No known sick contacts recently.   [facility] office note 03/30/22 Mr. Miralles is a 47 year old right-handed gentleman with an underlying medical history of hypertension, hyperlipidemia, kidney stone, ventral hernia, hepatic steatosis and obesity, who presents for follow-up consultation of his obstructive sleep apnea, on BiPAP therapy.  The patient is unaccompanied today. I last saw him on 12/26/21, at which time he was not using his BiPAP consistently.  He had an average usage of less than 2 hours in the months of March through May.  He reported that he had a tendency to pull the mask off in the middle of the night and not realizing it.  There were several nights where he did not use his machine at all.  He sent a message through MyChart in the interim in May 2023 inquiring if a sleep aid like trazodone would help him.  He was advised to try melatonin for now.  He did get a prescription in the interim for trazodone through his primary care.   Today, 03/30/2022: I reviewed his compliance data from 01/07/2022 through 03/27/2022, which is a total of 90 days, during which time he used his machine 25 days, no usage since mid June with sporadic usage in early June and intermittent usage and mid May to mid June.  Average usage of 1 hour and 50 minutes, percent use days greater than 4 hours at 1% only.  Average AHI 19.4/h with primarily obstructive events, pressure of 13 over 9 cm, leak on the higher side with the 95th percentile at 30.6 L/min.  He reports not using his machine lately.  He has not used it essentially since June.  He tried trazodone but it made  him too groggy the next day despite using only half a pill.  He no longer uses it.  He tried 3-4 different CPAP masks.  The most recent is a medium F20 fullface mask.  He feels that it consistently dislodges overnight.  He is working on weight loss, he was on Saxenda but it was denied by his insurance.  We talked about potential  alternative treatment options which are very limited.  He is potentially a candidate for inspire down the road so long as he is successful with his weight loss.  Our target would be a BMI of up to 32.  In the meantime, he is willing to keep trying the BiPAP and I provided him with a Respironics Amara fullface mask sample size large.  We talked about the importance of treating severe sleep apnea to prevent cardiovascular complications.  05/09/21: (He) was previously diagnosed with obstructive sleep apnea.  He is currently no longer on his AutoPap, stopped using it a few months ago.  He has been struggling with tolerance, particularly with regards to the mask.  He reports that he tosses and turns and he is a restless sleeper and pulls off the mask in the middle of the night without realizing it.  Snoring has become worse and apneas are witnessed by his wife.  He has had worsening daytime somnolence.  He has gained weight over time.  His father has sleep apnea but does not have a CPAP machine, his maternal grandmother also has sleep apnea.  He received his current machine about 3 to 4 years ago which was more tolerable than his original machine.  We could not get a compliance download as the [employer] website was down.  He had a sleep study on 02/03/2005 which indicated an RDI of 16/h, O2 nadir 86%.  He was advised to start AutoPap therapy, study was interpreted by Dr. Marcelyn Bruins.  I reviewed your office note from 03/03/2021.  His Epworth sleepiness score is 19 out of 24, fatigue severity score is 48 out of 63.  He is married and lives with his wife, he has a total of 5 children, his 20 year old daughter lives with them.  He quit smoking some 4 years ago, he uses nicotine vapor.  He drinks alcohol up to 3 glasses of wine per week, caffeine in the form of coffee, 1 cup in the morning and 2 to 4 glasses of tea per day on average.  He does have a TV in his bedroom but does not watch it in his bedroom.  They have 1 dog in  the household, dog does not typically sleep on the bed with them.  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  Bedtime is generally between 11 and 11:30 PM and rise time between 6:30 AM and 7 AM.  He does not have night to night nocturia or recurrent morning headaches.  In summary, Kishon Guanzon. is a very pleasant 47 year old male with an underlying medical history of hypertension, hyperlipidemia, kidney stone, ventral hernia, hepatic steatosis and obesity, who presents for follow-up consultation of his severe obstructive sleep apnea.  His baseline sleep study from 08/30/2021 showed an AHI of 68.1/h, O2 nadir 63%.  He did relatively well with BiPAP during his titration study on 09/20/2021.  He has not been able to be consistent with his BiPAP.  His compliance is low, he has essentially stopped using his machine since June 2023.  He has residual obstructive events even  on BiPAP therapy, he could not tolerate CPAP.  His treatment options are limited.  He has tried several different masks and is agreeable to trying the Respironics Amara fullface mask.  We talked about inspire and the indications.  He is advised to continue to strive for weight loss, once he reaches a BMI of 32 or less, we can reassess his sleep apnea with a home sleep test and consider inspire treatment.  For now, he is encouraged to continue to work on his BiPAP compliance. He is advised to follow-up to see one of our nurse practitioners in about 3 to 4 months, sooner if needed.  We may be able to increase the pressure to 14 over 10 cm on his BiPAP at the time so long as he has been able to be consistent with treatment and has adjusted to treatment better. I answered all his questions today and he was in agreement with our plan. I spent 40 minutes in total face-to-face time and in reviewing records during pre-charting, more than 50% of which was spent in counseling and coordination of care, reviewing test results, reviewing  medications and treatment regimen and/or in discussing or reviewing the diagnosis of OSA, the prognosis and treatment options. Pertinent laboratory and imaging test results that were available during this visit with the patient were reviewed by me and considered in my medical decision making (see chart for details).       Past Medical/Surgical History Past Medical History:  Diagnosis Date   Hepatic steatosis    Hypertension    Kidney stones 2009   Ventral hernia     His  has a past surgical history that includes Wisdom tooth extraction and Vasectomy.  Past Family/Social History His family history includes Colon cancer in his maternal grandfather; Colon polyps in his father; Diabetes in his father; Heart disease in his father and maternal grandmother; Hypertension in his father, maternal grandfather, maternal grandmother, paternal grandfather, and paternal grandmother; Kidney disease in his maternal grandmother; Liver cancer in his maternal grandmother; Liver disease in his father; Pancreatic cancer in his paternal grandfather; Prostate cancer in his maternal grandfather; Sleep apnea in his father and maternal grandmother. He  reports that he has been smoking e-cigarettes. He has never used smokeless tobacco. He reports that he does not currently use alcohol after a past usage of about 1.0 standard drink of alcohol per week. He reports that he does not use drugs.  Medications/Allergies/Immunizations Allergies: Oxycodone-acetaminophen and Sulfa antibiotics, Immunizations:  Immunization History  Administered Date(s) Administered   Influenza,inj,Quad PF,6+ Mos 05/04/2022   Influenza-Unspecified 05/06/2014, 05/20/2019, 05/07/2020   Moderna Sars-Covid-2 Vaccination 11/18/2019, 12/12/2019    Review of Systems  ROS: Constitutional: Negative for fever, weight loss and weight gain. Cardiovascular: Negative for chest pain and dyspnea on exertion. Respiratory: Is not experiencing shortness of  breath at rest. Gastrointestinal: Negative for nausea and vomiting. Neurological: Negative for headaches. Psychiatric: The patient is not nervous/anxious  OBJECTIVE:  Physical Exam Blood pressure 133/82, pulse 70, height 5\' 11"  (1.803 m), weight 216 lb (98 kg), SpO2 98%. General:  Well-developed, well-nourished, no apparent distress Respiratory Respiratory effort:  Equal inspiration and expiration without stridor Auscultation:  Equal breath sounds bilaterally Cardiovascular Heart:  regular rate and rhythm Peripheral Vascular:  Warm extremities with equal pulses Eyes: No nystagmus with equal extraocular motion bilaterally Neuro/Psych/Balance: Patient oriented to person, place, and time;  Appropriate mood and affect;  Gait is intact with no imbalance; Cranial nerves I-XII are intact Head and Face  Inspection:  Normocephalic and atraumatic without mass or lesion Palpation:  Facial skeleton intact without bony stepoffs Salivary Glands:  No masses or tenderness Facial Strength:  Facial motility symmetric and full bilaterally ENT Pinna:  External ear intact and fully developed bilaterally External canal:  Canal is patent with intact skin bilaterally Tympanic Membrane:  Clear and mobile bilaterally Hearing: Midline Weber, pos Rinne bilaterally, normal clinical speech reception threshold (whispered voice, finger rub) External nose:  No scar or anatomic deformity Internal Nose:  Septum intact and midline.  No edema, polyp, or rhinorrhea. Lips, Teeth, and gums:  Mucosa and teeth intact and viable TMJ:  No pain to palpation with full mobility Oral cavity/oropharynx:  No erythema or exudate, No tonsils Tongue/palate position: Friedman 4 Nasopharynx:  No mass or lesion with intact mucosa, purulent secretions along the choanae on the right Hypopharynx:  Intact mucosa without pooling of secretions Larynx:  Full true vocal cord mobility without lesion or mass, moderate post-cricoid  edema/pachydermia Neck Neck and Trachea:  Midline trachea without mass or lesion Thyroid:  No mass or nodularity Lymphatics:  No lymphadenopathy  Preoperative diagnosis: OSA  Postoperative diagnosis:   Same  Procedure: Flexible fiberoptic laryngoscopy  Surgeon: Ashok Croon, MD  Anesthesia: Topical lidocaine and Afrin Complications: None Condition is stable throughout exam  Indications and consent:  The patient presents to the clinic with Indirect laryngoscopy view was incomplete. Thus it was recommended that they undergo a flexible fiberoptic laryngoscopy. All of the risks, benefits, and potential complications were reviewed with the patient preoperatively and verbal informed consent was obtained.  Procedure: The patient was seated upright in the clinic. Topical lidocaine and Afrin were applied to the nasal cavity. After adequate anesthesia had occurred, I then proceeded to pass the flexible telescope into the nasal cavity. The nasal cavity was patent without rhinorrhea or polyp. The nasopharynx was also patent without mass or lesion. The base of tongue was visualized and was normal. There were no signs of pooling of secretions in the piriform sinuses. The true vocal folds were mobile bilaterally. There were no signs of glottic or supraglottic mucosal lesion or mass. There was moderate interarytenoid pachydermia and post cricoid edema. The telescope was then slowly withdrawn and the patient tolerated the procedure throughout.  Diagnostic Sleep Study 08/30/21 Done with [facility] BMI 36.7 AHI of 68.1 desats to 68% no cental apneas   ASSESSMENT/PLAN: Encounter Diagnoses  Name Primary?   OSA (obstructive sleep apnea) Yes   Intolerance of continuous positive airway pressure (CPAP) ventilation    BMI 30.0-30.9,adult    Acute bacterial sinusitis    Nasal congestion    Assessment and Plan    Obstructive Sleep Apnea (OSA) , BMI of 30.13 today   Severe OSA with an AHI of 68 diagnosed in  2023 (sleep study report reviewed). Failed CPAP and BiPAP therapy due to intolerance. No central apneas, making him a candidate for Inspire implant.  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 Zachery Conch, M et al. Staging of obstructive sleep apnea/hypopnea syndrome: a guide to appropriate treatment. Laryngoscope, 2004 Mar, 114(3):454-9. PMID: [id number]) Options including positional therapy, weight loss, oral appliances, PAP and surgical correction discussed. Pt is not an ideal candidate for oral appliance due to severity of OSA Pt could be a candidate for Hypoglossal nerve stimulation (Inspire therapy) pending DISE  Discussed Inspire implant procedure, including mechanism, benefits, and risks (hematoma, seroma, nerve weakness, lung injury). Explained  high success rate (~90% significant improvement in AHI). He expressed understanding and interest in proceeding.   - Ordered drug-induced sleep endoscopy to assess for complete concentric collapse at the soft palate    Sinus Infection  on scope exam today and sx of nasal congestion Symptoms of URI cold for two weeks with yellow mucus on scope exam today and upper respiratory symptoms. Previous treatment with Augmentin and nasal spray.    - Prescribe doxycycline 100 mg BID for 10 days   - Prescribe Medrol dose pack   - Continue Zyrtec and Flonase   - Recommend use of neti pot for nasal irrigation    GERD LPR seen on scope exam today  - diet and lifestyle changes to minimize reflux   Follow-up   - Follow up with surgery scheduler for drug-induced sleep endoscopy   - Monitor response to antibiotics and steroids for sinus infection.

## 2023-07-24 ENCOUNTER — Ambulatory Visit (HOSPITAL_BASED_OUTPATIENT_CLINIC_OR_DEPARTMENT_OTHER): Admission: RE | Admit: 2023-07-24 | Payer: Commercial Managed Care - PPO | Source: Home / Self Care

## 2023-07-24 ENCOUNTER — Encounter (HOSPITAL_BASED_OUTPATIENT_CLINIC_OR_DEPARTMENT_OTHER): Admission: RE | Payer: Self-pay | Source: Home / Self Care

## 2023-07-24 ENCOUNTER — Other Ambulatory Visit: Payer: Self-pay | Admitting: Family Medicine

## 2023-07-24 SURGERY — DRUG INDUCED ENDOSCOPY
Anesthesia: Choice

## 2023-08-20 ENCOUNTER — Other Ambulatory Visit: Payer: Self-pay

## 2023-08-20 ENCOUNTER — Encounter (HOSPITAL_COMMUNITY): Payer: Self-pay | Admitting: General Practice

## 2023-08-20 NOTE — Progress Notes (Signed)
SDW CALL  Patient was given pre-op instructions over the phone. The opportunity was given for the patient to ask questions. No further questions asked. Patient verbalized understanding of instructions given.   PCP - Dr. Lubertha South Cardiologist - denies  PPM/ICD - denies Device Orders - n/a Rep Notified - n/a  Chest x-ray - 08/28/22 EKG - DOS Stress Test - over 10 years ago  ECHO - denies Cardiac Cath - denies  Sleep Study - OSA+ CPAP - does not wear a CPAP  No DM  Last dose of GLP1 agonist-  n/a GLP1 instructions: n/a  Blood Thinner Instructions: n/a Aspirin Instructions: n/a  ERAS Protcol - clears until 1000   COVID TEST- n/a   Anesthesia review: no  Patient denies shortness of breath, fever, cough and chest pain over the phone call   All instructions explained to the patient, with a verbal understanding of the material. Patient agrees to go over the instructions while at home for a better understanding.

## 2023-08-24 ENCOUNTER — Ambulatory Visit (HOSPITAL_COMMUNITY): Payer: Commercial Managed Care - PPO | Admitting: Anesthesiology

## 2023-08-24 ENCOUNTER — Encounter (HOSPITAL_COMMUNITY): Payer: Self-pay | Admitting: *Deleted

## 2023-08-24 ENCOUNTER — Ambulatory Visit (HOSPITAL_COMMUNITY)
Admission: RE | Admit: 2023-08-24 | Discharge: 2023-08-24 | Disposition: A | Discharge status: Home / Self Care | Payer: Commercial Managed Care - PPO | Attending: Otolaryngology | Admitting: Otolaryngology

## 2023-08-24 ENCOUNTER — Encounter (HOSPITAL_COMMUNITY)
Admission: RE | Disposition: A | Discharge status: Home / Self Care | Payer: Self-pay | Source: Home / Self Care | Attending: Otolaryngology

## 2023-08-24 ENCOUNTER — Other Ambulatory Visit (INDEPENDENT_AMBULATORY_CARE_PROVIDER_SITE_OTHER): Payer: Self-pay | Admitting: Otolaryngology

## 2023-08-24 ENCOUNTER — Other Ambulatory Visit: Payer: Self-pay

## 2023-08-24 DIAGNOSIS — I10 Essential (primary) hypertension: Secondary | ICD-10-CM | POA: Diagnosis not present

## 2023-08-24 DIAGNOSIS — E785 Hyperlipidemia, unspecified: Secondary | ICD-10-CM

## 2023-08-24 DIAGNOSIS — F17210 Nicotine dependence, cigarettes, uncomplicated: Secondary | ICD-10-CM | POA: Diagnosis not present

## 2023-08-24 DIAGNOSIS — E7800 Pure hypercholesterolemia, unspecified: Secondary | ICD-10-CM | POA: Insufficient documentation

## 2023-08-24 DIAGNOSIS — Z79899 Other long term (current) drug therapy: Secondary | ICD-10-CM | POA: Diagnosis not present

## 2023-08-24 DIAGNOSIS — G4733 Obstructive sleep apnea (adult) (pediatric): Secondary | ICD-10-CM | POA: Diagnosis present

## 2023-08-24 DIAGNOSIS — F17290 Nicotine dependence, other tobacco product, uncomplicated: Secondary | ICD-10-CM | POA: Insufficient documentation

## 2023-08-24 DIAGNOSIS — Z789 Other specified health status: Secondary | ICD-10-CM

## 2023-08-24 HISTORY — DX: Personal history of urinary calculi: Z87.442

## 2023-08-24 HISTORY — DX: Dyspnea, unspecified: R06.00

## 2023-08-24 HISTORY — DX: Pure hypercholesterolemia, unspecified: E78.00

## 2023-08-24 HISTORY — PX: DRUG INDUCED ENDOSCOPY: SHX6808

## 2023-08-24 HISTORY — DX: Sleep apnea, unspecified: G47.30

## 2023-08-24 LAB — COMPREHENSIVE METABOLIC PANEL
ALT: 20 U/L (ref 0–44)
AST: 16 U/L (ref 15–41)
Albumin: 3.9 g/dL (ref 3.5–5.0)
Alkaline Phosphatase: 102 U/L (ref 38–126)
Anion gap: 8 (ref 5–15)
BUN: 9 mg/dL (ref 6–20)
CO2: 23 mmol/L (ref 22–32)
Calcium: 9.1 mg/dL (ref 8.9–10.3)
Chloride: 105 mmol/L (ref 98–111)
Creatinine, Ser: 0.7 mg/dL (ref 0.61–1.24)
GFR, Estimated: >60 mL/min (ref >60)
Glucose, Bld: 97 mg/dL (ref 70–99)
Potassium: 4 mmol/L (ref 3.5–5.1)
Sodium: 136 mmol/L (ref 135–145)
Total Bilirubin: 0.9 mg/dL (ref 0.0–1.2)
Total Protein: 6.4 g/dL — ABNORMAL LOW (ref 6.5–8.1)

## 2023-08-24 LAB — CBC
HCT: 41.8 % (ref 39.0–52.0)
Hemoglobin: 14.4 g/dL (ref 13.0–17.0)
MCH: 29.6 pg (ref 26.0–34.0)
MCHC: 34.4 g/dL (ref 30.0–36.0)
MCV: 86 fL (ref 80.0–100.0)
Platelets: 267 10*3/uL (ref 150–400)
RBC: 4.86 MIL/uL (ref 4.22–5.81)
RDW: 13.2 % (ref 11.5–15.5)
WBC: 6.2 10*3/uL (ref 4.0–10.5)
nRBC: 0 % (ref 0.0–0.2)

## 2023-08-24 SURGERY — DRUG INDUCED SLEEP ENDOSCOPY
Anesthesia: Monitor Anesthesia Care | Site: Nose

## 2023-08-24 MED ORDER — SODIUM CHLORIDE 0.9 % IV SOLN: INTRAVENOUS | Status: DC

## 2023-08-24 MED ORDER — LIDOCAINE-EPINEPHRINE 1 %-1:100000 IJ SOLN
INTRAMUSCULAR | Status: AC
Start: 2023-08-24 — End: ?
  Filled 2023-08-24: qty 1

## 2023-08-24 MED ORDER — OXYMETAZOLINE HCL 0.05 % NA SOLN
NASAL | Status: AC
  Filled 2023-08-24: qty 30

## 2023-08-24 MED ORDER — OXYMETAZOLINE HCL 0.05 % NA SOLN
NASAL | Status: DC | PRN
  Administered 2023-08-24: 4

## 2023-08-24 MED ORDER — PROPOFOL 10 MG/ML IV BOLUS
INTRAVENOUS | Status: DC | PRN
  Administered 2023-08-24: 50 mg via INTRAVENOUS

## 2023-08-24 MED ORDER — ORAL CARE MOUTH RINSE: 15.0000 mL | Freq: Once | OROMUCOSAL | Status: AC

## 2023-08-24 MED ORDER — LIDOCAINE-EPINEPHRINE 1 %-1:100000 IJ SOLN
INTRAMUSCULAR | Status: DC | PRN
  Administered 2023-08-24: 10 mL

## 2023-08-24 MED ORDER — CHLORHEXIDINE GLUCONATE 0.12 % MT SOLN
15.0000 mL | Freq: Once | OROMUCOSAL | Status: AC
Start: 2023-08-24 — End: 2023-08-24
  Administered 2023-08-24: 15 mL via OROMUCOSAL
  Filled 2023-08-24: qty 15

## 2023-08-24 MED ORDER — PROPOFOL 500 MG/50ML IV EMUL
INTRAVENOUS | Status: DC | PRN
  Administered 2023-08-24: 100 ug/kg/min via INTRAVENOUS

## 2023-08-24 MED ORDER — LIDOCAINE 2% (20 MG/ML) 5 ML SYRINGE
INTRAMUSCULAR | Status: DC | PRN
  Administered 2023-08-24: 60 mg via INTRAVENOUS

## 2023-08-24 SURGICAL SUPPLY — 1 items: BRONCHOSCOPE PED SLIM DISP (MISCELLANEOUS) IMPLANT

## 2023-08-24 NOTE — Op Note (Signed)
 Operative note  Preoperative diagnosis: OSA Postoperative diagnosis:   Same  Procedure:DISE ( Drug induced sleep endoscopy)  Anesthesia: Topical lidocaine  gel Complications: None Condition is stable throughout exam  Indications and consent:   The patient presents to my otolaryngology clinic with a chief complaint of OSA  Because of pt's OSA and desire to be a candidate for Inspire therapy, it was recommended that they undergo a flexible fiberoptic laryngoscopy under sedation (DISE).   All the risks, benefits, and potential complications were reviewed with the patient preoperatively and informed consent was obtained.  Procedure: Pt was brought back to the OR and laid supine on the table. Propofol  anesthesia was administered per protocol until pt reached optimal level of sedation. DISE exam showed the following anatomic collapse pattern.  VOTE classification Velopharynx- 2 A-P Oropharynx- 1 Tongue base- 2 Epiglottis- 2 improved with jaw thrust    Based on the DISE findings, pt is a candidate for Hypoglossal nerve stimulation (Inspire therapy) based on the above anatomy.

## 2023-08-24 NOTE — H&P (Signed)
 Johnathan Parsons. is an 48 y.o. male.    Chief Complaint:  OSA and CPAP intolerance  HPI: Patient presents today for planned elective procedure.  He/she denies any interval change in history since office visit on 07/12/23.   Past Medical History:  Diagnosis Date   Dyspnea    Hepatic steatosis    High cholesterol    History of kidney stones    Hypertension    Sleep apnea    Ventral hernia     Past Surgical History:  Procedure Laterality Date   COLONOSCOPY     VASECTOMY     WISDOM TOOTH EXTRACTION      Family History  Problem Relation Age of Onset   Hypertension Father    Diabetes Father    Heart disease Father    Colon polyps Father    Liver disease Father    Sleep apnea Father    Hypertension Maternal Grandmother    Heart disease Maternal Grandmother    Liver cancer Maternal Grandmother    Kidney disease Maternal Grandmother    Sleep apnea Maternal Grandmother    Hypertension Maternal Grandfather    Prostate cancer Maternal Grandfather    Colon cancer Maternal Grandfather    Hypertension Paternal Grandmother    Hypertension Paternal Grandfather    Pancreatic cancer Paternal Grandfather    Gallbladder disease Neg Hx    Esophageal cancer Neg Hx     Social History:  reports that he has been smoking e-cigarettes. He has never used smokeless tobacco. He reports that he does not currently use alcohol after a past usage of about 1.0 standard drink of alcohol per week. He reports that he does not use drugs.  Allergies:  Allergies  Allergen Reactions   Oxycodone -Acetaminophen  Itching   Sulfa Antibiotics Hives    Medications Prior to Admission  Medication Sig Dispense Refill   atorvastatin  (LIPITOR) 20 MG tablet Take 1 tablet (20 mg total) by mouth daily. 90 tablet 1   lisinopril  (ZESTRIL ) 5 MG tablet Take 1 tablet (5 mg total) by mouth daily. 90 tablet 1   Multiple Vitamin (MULTIVITAMIN PO) Take 1 tablet by mouth daily.     sertraline  (ZOLOFT ) 50 MG tablet Take 1  tablet by mouth once daily 90 tablet 0   albuterol  (VENTOLIN  HFA) 108 (90 Base) MCG/ACT inhaler Inhale 2 puffs every 4 hours as needed for wheezing (Patient taking differently: Inhale 2 puffs into the lungs every 4 (four) hours as needed for wheezing or shortness of breath. Inhale 2 puffs every 4 hours as needed for wheezing) 1 each 2   cetirizine  (ZYRTEC ) 10 MG tablet Take 1 tablet (10 mg total) by mouth daily. (Patient not taking: Reported on 07/12/2023) 30 tablet 0   diclofenac  (VOLTAREN ) 75 MG EC tablet Take 1 tablet by mouth twice daily (Patient not taking: Reported on 08/13/2023) 60 tablet 0   famotidine  (PEPCID ) 40 MG tablet Take 1 tablet (40 mg total) by mouth daily. (Patient not taking: Reported on 07/12/2023) 30 tablet 2   fluticasone  (FLONASE ) 50 MCG/ACT nasal spray Place 2 sprays into both nostrils daily. (Patient not taking: Reported on 08/13/2023) 16 g 0   lidocaine  (XYLOCAINE ) 2 % solution Use as directed 10 mLs in the mouth or throat every 3 (three) hours as needed. (Patient not taking: Reported on 08/13/2023) 100 mL 0   promethazine -dextromethorphan (PROMETHAZINE -DM) 6.25-15 MG/5ML syrup Take 5 mLs by mouth 4 (four) times daily as needed. (Patient not taking: Reported on 07/12/2023) 118 mL  0   sildenafil  (VIAGRA ) 100 MG tablet Take 1/2-1 tablet po one hour before relations. No more than one per day. (Patient not taking: Reported on 07/12/2023) 8 tablet 6    Results for orders placed or performed during the hospital encounter of 08/24/23 (from the past 48 hours)  CBC per protocol     Status: None   Collection Time: 08/24/23 11:05 AM  Result Value Ref Range   WBC 6.2 4.0 - 10.5 K/uL   RBC 4.86 4.22 - 5.81 MIL/uL   Hemoglobin 14.4 13.0 - 17.0 g/dL   HCT 58.1 60.9 - 47.9 %   MCV 86.0 80.0 - 100.0 fL   MCH 29.6 26.0 - 34.0 pg   MCHC 34.4 30.0 - 36.0 g/dL   RDW 86.7 88.4 - 84.4 %   Platelets 267 150 - 400 K/uL   nRBC 0.0 0.0 - 0.2 %    Comment: Performed at [facility], [address], [place], [place] [address]   No results found.  ROS: Review of Systems  Constitutional:  Negative for chills and fever.  HENT:  Negative for ear pain and sore throat.   Respiratory:  Negative for cough.   Cardiovascular:  Negative for chest pain.  Gastrointestinal:  Negative for nausea and vomiting.  Genitourinary:  Negative for flank pain.  Musculoskeletal:  Negative for neck pain.  Skin:  Negative for rash.  Neurological:  Negative for dizziness.  Psychiatric/Behavioral:  The patient does not have insomnia.     Blood pressure (!) 127/96, pulse 62, temperature 98.3 F (36.8 C), temperature source Oral, resp. rate 18, height 5' 11 (1.803 m), weight 95.3 kg, SpO2 98%.  PHYSICAL EXAM: Physical Exam Constitutional:      Appearance: Normal appearance.  HENT:     Head: Normocephalic and atraumatic.     Right Ear: External ear normal.     Left Ear: External ear normal.     Nose: Nose normal.  Eyes:     Extraocular Movements: Extraocular movements intact.     Pupils: Pupils are equal, round, and reactive to light.  Cardiovascular:     Rate and Rhythm: Normal rate.  Pulmonary:     Effort: Pulmonary effort is normal.     Breath sounds: No stridor.  Abdominal:     General: Abdomen is flat.  Musculoskeletal:        General: Normal range of motion.     Cervical back: Normal range of motion.  Skin:    General: Skin is warm and dry.  Neurological:     Mental Status: He is alert and oriented to person, place, and time.  Psychiatric:        Mood and Affect: Mood normal.     Assessment/Plan OSA CPAP intolerance  - will proceed with DISE to determine candidacy for Inspire Implant.     Davone Shinault 08/24/2023, 11:54 AM

## 2023-08-24 NOTE — Anesthesia Postprocedure Evaluation (Signed)
 Anesthesia Post Note  Patient: Johnathan Parsons.  Procedure(s) Performed: DRUG INDUCED SLEEP ENDOSCOPY (Nose)     Patient location during evaluation: PACU Anesthesia Type: MAC Level of consciousness: awake and alert Pain management: pain level controlled Vital Signs Assessment: post-procedure vital signs reviewed and stable Respiratory status: spontaneous breathing, nonlabored ventilation and respiratory function stable Cardiovascular status: stable and blood pressure returned to baseline Postop Assessment: no apparent nausea or vomiting Anesthetic complications: no   No notable events documented.  Last Vitals:  Vitals:   08/24/23 1330 08/24/23 1340  BP: (!) 138/96 (!) 150/90  Pulse: (!) 53 (!) 54  Resp: 14 13  Temp:  36.6 C  SpO2: 97% 97%    Last Pain:  Vitals:   08/24/23 1340  TempSrc:   PainSc: 0-No pain                 Lailah Marcelli A.

## 2023-08-24 NOTE — Progress Notes (Signed)
 Passed DISE, surgery order placed for [facility] Implant

## 2023-08-24 NOTE — Transfer of Care (Signed)
 Immediate Anesthesia Transfer of Care Note  Patient: Johnathan Parsons.  Procedure(s) Performed: DRUG INDUCED SLEEP ENDOSCOPY (Nose)  Patient Location: PACU  Anesthesia Type:MAC  Level of Consciousness: awake, alert , and oriented  Airway & Oxygen Therapy: Patient Spontanous Breathing and Patient connected to nasal cannula oxygen  Post-op Assessment: Report given to RN and Post -op Vital signs reviewed and stable  Post vital signs: Reviewed and stable  Last Vitals:  Vitals Value Taken Time  BP 155/90 08/24/23 1325  Temp 36.6 C 08/24/23 1310  Pulse 54 08/24/23 1326  Resp 12 08/24/23 1326  SpO2 96 % 08/24/23 1326  Vitals shown include unfiled device data.  Last Pain:  Vitals:   08/24/23 1310  TempSrc:   PainSc: 0-No pain         Complications: No notable events documented.

## 2023-08-24 NOTE — Anesthesia Preprocedure Evaluation (Addendum)
 Anesthesia Evaluation  Patient identified by MRN, date of birth, ID band Patient awake    Reviewed: Allergy & Precautions, NPO status , Patient's Chart, lab work & pertinent test results, reviewed documented beta blocker date and time   Airway Mallampati: III  TM Distance: >3 FB Neck ROM: Full    Dental no notable dental hx. (+) Dental Advisory Given, Teeth Intact   Pulmonary shortness of breath and with exertion, sleep apnea , Current Smoker and Patient abstained from smoking.   Pulmonary exam normal breath sounds clear to auscultation       Cardiovascular hypertension, Pt. on medications Normal cardiovascular exam Rhythm:Regular Rate:Normal     Neuro/Psych negative neurological ROS  negative psych ROS   GI/Hepatic negative GI ROS, Neg liver ROS,,,  Endo/Other  Hyperlipidemia  Renal/GU negative Renal ROS  negative genitourinary   Musculoskeletal negative musculoskeletal ROS (+)    Abdominal Normal abdominal exam  (+)   Peds  Hematology negative hematology ROS (+)   Anesthesia Other Findings   Reproductive/Obstetrics                             Anesthesia Physical Anesthesia Plan  ASA: 3  Anesthesia Plan: MAC   Post-op Pain Management: Minimal or no pain anticipated   Induction: Intravenous  PONV Risk Score and Plan: 2 and Treatment may vary due to age or medical condition and Propofol  infusion  Airway Management Planned: Natural Airway  Additional Equipment: None  Intra-op Plan:   Post-operative Plan:   Informed Consent: I have reviewed the patients History and Physical, chart, labs and discussed the procedure including the risks, benefits and alternatives for the proposed anesthesia with the patient or authorized representative who has indicated his/her understanding and acceptance.     Dental advisory given  Plan Discussed with: CRNA and Anesthesiologist  Anesthesia  Plan Comments:        Anesthesia Quick Evaluation

## 2023-08-24 NOTE — Discharge Instructions (Addendum)
DRUG-INDUCED SLEEP ENDOSCOPY POST-OPERATIVE INSTRUCTIONS:  Based on the drug-induced sleep endoscopy today, you were deemed a candidate for Inspire Therapy.  Please review post-operative and recovery instructions below that you will need to be aware of after Inspire Implant surgery.  Please restart all of your home medications if you take anything on a daily basis.  You can resume regular diet after this procedure.  You will be scheduled for pre-operative appointment with Dr. Irene Pap to review details about surgery and to discuss the next steps.   DIET: Resume normal diet HYGIENE: Please wait until 48 hours after surgery before getting incisions on neck, chest, and torso wet. In the first 48 hours after surgery, will likely need to take sponge baths. WOUND CARE: Please leave pressure dressing on for 48 hours after surgery. Gently place antibiotic ointment over incisions 2 times per day; use clean q-tip. May place a clean bandage over incisions as needed. After 48 hours, you may get incisions wet with warm soap and water, but do not soak the incisions.  Pat area dry gently.  Immediately place antibiotic ointment. Take oral antibiotics as prescribed If skin around incision starts to get red (> 1cm), swollen, and/or more painful, please call the office ACTIVITY: Try to avoid sleeping on the side of your surgery, to the extent possible.   You may walk for exercise starting the day after surgery. For 2 weeks: Do not pick up anything greater than 5 pounds with the hand/arm that's on the same side as the surgery.  After 2 weeks, you may increase weight to 10 pounds.   Consider performing neck rolls 10 clockwise and 10 counterclockwise 3x/day. For 4 weeks, no strenuous activity (running, jogging, lifting weights, gardening, sports) or until cleared by physician.   PAIN MEDICATIONS: You will be prescribed narcotic pain medication for pain.   If pain is not severe, consider taking Tylenol 650mg   every 6 hours Avoid aspirin for 7 days after surgery Avoid direct heat (such as heating pads) to incision sites.   May gently place ice over surgery sites as needed.  Please place a thin clean towel over skin first and then place ice bag over towel.  Ice for 10 minutes at a time only.  POST-OPERATIVE CLINIC APPOINTMENTS: 1 week: suture removal and wound check in the office.  1 month: device activation and wound check in the office. 2.5 months: check in visit to assess usage. 3-4 months: titration sleep study based on usage of >4 hrs/night.  4 months: final wound check in the office.  Yearly: device check at office.  SCAR CARE: After incisions have healed, you will have a scar, which will continue to evolve over the course of 12 months.  Caring for your incision scars will help them to be as minimal as possible. If you are out in the sun with incision exposure, please remember to place sunscreen over the incision and surrounding skin.   You may use vitamin E or "Scar ointment/cream" to help soften scar.  Please wait one month after surgery before starting this.

## 2023-08-25 ENCOUNTER — Encounter (HOSPITAL_COMMUNITY): Payer: Self-pay | Admitting: Otolaryngology

## 2023-09-29 ENCOUNTER — Other Ambulatory Visit: Payer: Self-pay | Admitting: Family Medicine

## 2023-10-03 ENCOUNTER — Other Ambulatory Visit: Payer: Self-pay | Admitting: Family Medicine

## 2023-10-04 ENCOUNTER — Encounter: Payer: Self-pay | Admitting: Family Medicine

## 2023-10-04 ENCOUNTER — Other Ambulatory Visit: Payer: Self-pay | Admitting: Family Medicine

## 2023-10-18 ENCOUNTER — Encounter (HOSPITAL_COMMUNITY)
Admission: RE | Admit: 2023-10-18 | Discharge: 2023-10-18 | Disposition: A | Discharge status: Home / Self Care | Payer: Commercial Managed Care - PPO | Source: Ambulatory Visit | Attending: Otolaryngology | Admitting: Otolaryngology

## 2023-10-18 ENCOUNTER — Other Ambulatory Visit: Payer: Self-pay

## 2023-10-18 ENCOUNTER — Encounter (HOSPITAL_COMMUNITY): Payer: Self-pay

## 2023-10-18 VITALS — BP 113/82 | HR 65 | Temp 98.2°F | Resp 17 | Ht 71.0 in | Wt 208.2 lb

## 2023-10-18 DIAGNOSIS — Z01818 Encounter for other preprocedural examination: Secondary | ICD-10-CM | POA: Insufficient documentation

## 2023-10-18 LAB — CBC
HCT: 43.5 % (ref 39.0–52.0)
Hemoglobin: 14.8 g/dL (ref 13.0–17.0)
MCH: 29.8 pg (ref 26.0–34.0)
MCHC: 34 g/dL (ref 30.0–36.0)
MCV: 87.5 fL (ref 80.0–100.0)
Platelets: 261 10*3/uL (ref 150–400)
RBC: 4.97 MIL/uL (ref 4.22–5.81)
RDW: 12.8 % (ref 11.5–15.5)
WBC: 8.1 10*3/uL (ref 4.0–10.5)
nRBC: 0 % (ref 0.0–0.2)

## 2023-10-18 LAB — COMPREHENSIVE METABOLIC PANEL
ALT: 19 U/L (ref 0–44)
AST: 16 U/L (ref 15–41)
Albumin: 3.9 g/dL (ref 3.5–5.0)
Alkaline Phosphatase: 82 U/L (ref 38–126)
Anion gap: 5 (ref 5–15)
BUN: 11 mg/dL (ref 6–20)
CO2: 28 mmol/L (ref 22–32)
Calcium: 8.8 mg/dL — ABNORMAL LOW (ref 8.9–10.3)
Chloride: 106 mmol/L (ref 98–111)
Creatinine, Ser: 0.73 mg/dL (ref 0.61–1.24)
GFR, Estimated: >60 mL/min (ref >60)
Glucose, Bld: 93 mg/dL (ref 70–99)
Potassium: 4.1 mmol/L (ref 3.5–5.1)
Sodium: 139 mmol/L (ref 135–145)
Total Bilirubin: 0.6 mg/dL (ref 0.0–1.2)
Total Protein: 6.7 g/dL (ref 6.5–8.1)

## 2023-10-18 NOTE — Progress Notes (Signed)
 PCP - Tessie Fass Cardiologist - denies  PPM/ICD - denies Device Orders -  Rep Notified -   Chest x-ray - na EKG - 10/18/23 Stress Test - more than 10 years ago ECHO - denies Cardiac Cath - denies  Sleep Study - 2023-+OSA CPAP - could not tolerate  Fasting Blood Sugar - na Checks Blood Sugar _____ times a day  Last dose of GLP1 agonist-  na GLP1 instructions:   Blood Thinner Instructions:na Aspirin Instructions:na  ERAS Protcol -no PRE-SURGERY Ensure or G2-   COVID TEST- na   Anesthesia review: no  Patient denies shortness of breath, fever, cough and chest pain at PAT appointment   All instructions explained to the patient, with a verbal understanding of the material. Patient agrees to go over the instructions while at home for a better understanding. Patient also instructed to wear a mask when out in public prior to surgery. The opportunity to ask questions was provided.

## 2023-10-18 NOTE — Progress Notes (Signed)
 Surgical Instructions   Your procedure is scheduled on March 5. Report to [facility] Main Entrance "A" at 6:30 A.M., then check in with the Admitting office. Any questions or running late day of surgery: call [phone]  Questions prior to your surgery date: call [phone], Monday-Friday, 8am-4pm. If you experience any cold or flu symptoms such as cough, fever, chills, shortness of breath, etc. between now and your scheduled surgery, please notify us at the above number.     Remember:  Do not eat or drink anything after midnight the night before your surgery    Take these medicines the morning of surgery with A SIP OF WATER  atorvastatin (LIPITOR)  sertraline (ZOLOFT)   May take these medicines IF NEEDED: albuterol (VENTOLIN HFA) -bring inhaler to the hospital   One week prior to surgery, STOP taking any Aspirin (unless otherwise instructed by your surgeon) diclofenac (VOLTAREN),Aleve, Naproxen, Ibuprofen, Motrin, Advil, Goody's, BC's, all herbal medications, fish oil, and non-prescription vitamins.                     Do NOT Smoke (Tobacco/Vaping) for 24 hours prior to your procedure.  If you use a CPAP at night, you may bring your mask/headgear for your overnight stay.   You will be asked to remove any contacts, glasses, piercing's, hearing aid's, dentures/partials prior to surgery. Please bring cases for these items if needed.    Patients discharged the day of surgery will not be allowed to drive home, and someone needs to stay with them for 24 hours.  SURGICAL WAITING ROOM VISITATION Patients may have no more than 2 support people in the waiting area - these visitors may rotate.   Pre-op nurse will coordinate an appropriate time for 1 ADULT support person, who may not rotate, to accompany patient in pre-op.  Children under the age of 40 must have an adult with them who is not the patient and must remain in the main waiting area with an adult.  If the patient needs to  stay at the hospital during part of their recovery, the visitor guidelines for inpatient rooms apply.  Please refer to the [facility] website for the visitor guidelines for any additional information.   If you received a COVID test during your pre-op visit  it is requested that you wear a mask when out in public, stay away from anyone that may not be feeling well and notify your surgeon if you develop symptoms. If you have been in contact with anyone that has tested positive in the last 10 days please notify you surgeon.      Pre-operative CHG Bathing Instructions   You can play a key role in reducing the risk of infection after surgery. Your skin needs to be as free of germs as possible. You can reduce the number of germs on your skin by washing with CHG (chlorhexidine gluconate) soap before surgery. CHG is an antiseptic soap that kills germs and continues to kill germs even after washing.   DO NOT use if you have an allergy to chlorhexidine/CHG or antibacterial soaps. If your skin becomes reddened or irritated, stop using the CHG and notify one of our RNs at [phone].              TAKE A SHOWER THE NIGHT BEFORE SURGERY AND THE DAY OF SURGERY    Please keep in mind the following:  DO NOT shave, including legs and underarms, 48 hours prior to surgery.  You may shave your face before/day of surgery.  Place clean sheets on your bed the night before surgery Use a clean washcloth (not used since being washed) for each shower. DO NOT sleep with pet's night before surgery.  CHG Shower Instructions:  Wash your face and private area with normal soap. If you choose to wash your hair, wash first with your normal shampoo.  After you use shampoo/soap, rinse your hair and body thoroughly to remove shampoo/soap residue.  Turn the water OFF and apply half the bottle of CHG soap to a CLEAN washcloth.  Apply CHG soap ONLY FROM YOUR NECK DOWN TO YOUR TOES (washing for 3-5 minutes)  DO NOT use CHG  soap on face, private areas, open wounds, or sores.  Pay special attention to the area where your surgery is being performed.  If you are having back surgery, having someone wash your back for you may be helpful. Wait 2 minutes after CHG soap is applied, then you may rinse off the CHG soap.  Pat dry with a clean towel  Put on clean pajamas    Additional instructions for the day of surgery: DO NOT APPLY any lotions, deodorants, cologne, or perfumes.   Do not wear jewelry or makeup Do not wear nail polish, gel polish, artificial nails, or any other type of covering on natural nails (fingers and toes) Do not bring valuables to the hospital. [facility] is not responsible for valuables/personal belongings. Put on clean/comfortable clothes.  Please brush your teeth.  Ask your nurse before applying any prescription medications to the skin.

## 2023-10-23 MED ORDER — SODIUM CHLORIDE 0.9 % IV SOLN
3.0000 g | INTRAVENOUS | Status: AC
  Administered 2023-10-24: 3 g via INTRAVENOUS
  Filled 2023-10-23 (×3): qty 8

## 2023-10-23 NOTE — Anesthesia Preprocedure Evaluation (Signed)
 Anesthesia Evaluation  Patient identified by MRN, date of birth, ID band Patient awake    Reviewed: Allergy & Precautions, NPO status , Patient's Chart, lab work & pertinent test results  History of Anesthesia Complications Negative for: history of anesthetic complications  Airway Mallampati: II  TM Distance: >3 FB Neck ROM: Full    Dental no notable dental hx. (+) Teeth Intact, Dental Advisory Given   Pulmonary sleep apnea , Current Smoker and Patient abstained from smoking.   Pulmonary exam normal breath sounds clear to auscultation       Cardiovascular hypertension, (-) angina (-) CAD and (-) Past MI Normal cardiovascular exam Rhythm:Regular Rate:Normal     Neuro/Psych negative neurological ROS  negative psych ROS   GI/Hepatic   Endo/Other    Renal/GU      Musculoskeletal   Abdominal   Peds  Hematology Lab Results      Component                Value               Date                      WBC                      8.1                 10/18/2023                HGB                      14.8                10/18/2023                HCT                      43.5                10/18/2023                MCV                      87.5                10/18/2023                PLT                      261                 10/18/2023              Anesthesia Other Findings   Reproductive/Obstetrics                             Anesthesia Physical Anesthesia Plan  ASA: 2  Anesthesia Plan: General   Post-op Pain Management:    Induction: Intravenous  PONV Risk Score and Plan: 2 and Treatment may vary due to age or medical condition, Midazolam, Ondansetron and Dexamethasone  Airway Management Planned: Oral ETT  Additional Equipment: None  Intra-op Plan:   Post-operative Plan: Extubation in OR  Informed Consent: I have reviewed the patients History and Physical, chart, labs and  discussed the procedure including the risks, benefits and alternatives for  the proposed anesthesia with the patient or authorized representative who has indicated his/her understanding and acceptance.     Dental advisory given  Plan Discussed with: CRNA and Surgeon  Anesthesia Plan Comments:        Anesthesia Quick Evaluation

## 2023-10-24 ENCOUNTER — Ambulatory Visit (HOSPITAL_COMMUNITY): Admitting: Anesthesiology

## 2023-10-24 ENCOUNTER — Ambulatory Visit (HOSPITAL_COMMUNITY)

## 2023-10-24 ENCOUNTER — Encounter (HOSPITAL_COMMUNITY)
Admission: RE | Disposition: A | Discharge status: Home / Self Care | Payer: Self-pay | Source: Home / Self Care | Attending: Otolaryngology

## 2023-10-24 ENCOUNTER — Ambulatory Visit (HOSPITAL_BASED_OUTPATIENT_CLINIC_OR_DEPARTMENT_OTHER): Admitting: Anesthesiology

## 2023-10-24 ENCOUNTER — Ambulatory Visit (HOSPITAL_COMMUNITY)
Admission: RE | Admit: 2023-10-24 | Discharge: 2023-10-24 | Disposition: A | Discharge status: Home / Self Care | Payer: Commercial Managed Care - PPO | Attending: Otolaryngology | Admitting: Otolaryngology

## 2023-10-24 ENCOUNTER — Other Ambulatory Visit: Payer: Self-pay

## 2023-10-24 ENCOUNTER — Encounter (HOSPITAL_COMMUNITY): Payer: Self-pay | Admitting: *Deleted

## 2023-10-24 DIAGNOSIS — F17290 Nicotine dependence, other tobacco product, uncomplicated: Secondary | ICD-10-CM | POA: Insufficient documentation

## 2023-10-24 DIAGNOSIS — Z6828 Body mass index (BMI) 28.0-28.9, adult: Secondary | ICD-10-CM | POA: Diagnosis not present

## 2023-10-24 DIAGNOSIS — G4733 Obstructive sleep apnea (adult) (pediatric): Secondary | ICD-10-CM | POA: Diagnosis present

## 2023-10-24 DIAGNOSIS — Z91198 Patient's noncompliance with other medical treatment and regimen for other reason: Secondary | ICD-10-CM | POA: Diagnosis not present

## 2023-10-24 DIAGNOSIS — I10 Essential (primary) hypertension: Secondary | ICD-10-CM | POA: Diagnosis not present

## 2023-10-24 HISTORY — PX: IMPLANTATION OF HYPOGLOSSAL NERVE STIMULATOR: SHX6827

## 2023-10-24 SURGERY — INSERTION, HYPOGLOSSAL NERVE STIMULATOR
Anesthesia: General | Site: Neck | Laterality: Right

## 2023-10-24 MED ORDER — FENTANYL CITRATE (PF) 250 MCG/5ML IJ SOLN
INTRAMUSCULAR | Status: DC | PRN
  Administered 2023-10-24: 50 ug via INTRAVENOUS

## 2023-10-24 MED ORDER — MIDAZOLAM HCL 2 MG/2ML IJ SOLN
INTRAMUSCULAR | Status: AC
  Filled 2023-10-24: qty 2

## 2023-10-24 MED ORDER — OXYCODONE HCL 5 MG PO TABS: 5.0000 mg | ORAL_TABLET | Freq: Once | ORAL | Status: DC | PRN

## 2023-10-24 MED ORDER — LIDOCAINE 2% (20 MG/ML) 5 ML SYRINGE
INTRAMUSCULAR | Status: DC | PRN
  Administered 2023-10-24: 100 mg via INTRAVENOUS

## 2023-10-24 MED ORDER — ONDANSETRON HCL 4 MG/2ML IJ SOLN: 4.0000 mg | Freq: Once | INTRAMUSCULAR | Status: DC | PRN

## 2023-10-24 MED ORDER — PROPOFOL 10 MG/ML IV BOLUS
INTRAVENOUS | Status: DC | PRN
  Administered 2023-10-24: 130 mg via INTRAVENOUS

## 2023-10-24 MED ORDER — EPHEDRINE 5 MG/ML INJ
INTRAVENOUS | Status: AC
Start: 2023-10-24 — End: ?
  Filled 2023-10-24: qty 5

## 2023-10-24 MED ORDER — ONDANSETRON HCL 4 MG/2ML IJ SOLN
INTRAMUSCULAR | Status: AC
  Filled 2023-10-24: qty 2

## 2023-10-24 MED ORDER — OXYCODONE HCL 5 MG PO TABS: 5.0000 mg | ORAL_TABLET | Freq: Four times a day (QID) | ORAL | 0 refills | Status: DC | PRN

## 2023-10-24 MED ORDER — SODIUM CHLORIDE 0.9 % IV SOLN
0.1500 ug/kg/min | INTRAVENOUS | Status: AC
  Administered 2023-10-24: .05 ug/kg/min via INTRAVENOUS
  Filled 2023-10-24: qty 2000

## 2023-10-24 MED ORDER — PROPOFOL 10 MG/ML IV BOLUS
INTRAVENOUS | Status: AC
  Filled 2023-10-24: qty 20

## 2023-10-24 MED ORDER — PHENYLEPHRINE HCL-NACL 20-0.9 MG/250ML-% IV SOLN
INTRAVENOUS | Status: DC | PRN
  Administered 2023-10-24: 20 ug/min via INTRAVENOUS

## 2023-10-24 MED ORDER — KETOROLAC TROMETHAMINE 30 MG/ML IJ SOLN: 30.0000 mg | Freq: Once | INTRAMUSCULAR | Status: DC | PRN

## 2023-10-24 MED ORDER — LACTATED RINGERS IV SOLN: INTRAVENOUS | Status: DC | PRN

## 2023-10-24 MED ORDER — CHLORHEXIDINE GLUCONATE 0.12 % MT SOLN
15.0000 mL | Freq: Once | OROMUCOSAL | Status: AC
  Administered 2023-10-24: 15 mL via OROMUCOSAL

## 2023-10-24 MED ORDER — SUCCINYLCHOLINE CHLORIDE 200 MG/10ML IV SOSY
PREFILLED_SYRINGE | INTRAVENOUS | Status: DC | PRN
  Administered 2023-10-24: 100 mg via INTRAVENOUS

## 2023-10-24 MED ORDER — HYDROMORPHONE HCL 1 MG/ML IJ SOLN: 0.2500 mg | INTRAMUSCULAR | Status: DC | PRN

## 2023-10-24 MED ORDER — EPHEDRINE SULFATE-NACL 50-0.9 MG/10ML-% IV SOSY
PREFILLED_SYRINGE | INTRAVENOUS | Status: DC | PRN
  Administered 2023-10-24 (×2): 5 mg via INTRAVENOUS
  Administered 2023-10-24: 10 mg via INTRAVENOUS
  Administered 2023-10-24: 5 mg via INTRAVENOUS

## 2023-10-24 MED ORDER — OXYCODONE HCL 5 MG/5ML PO SOLN: 5.0000 mg | Freq: Once | ORAL | Status: DC | PRN

## 2023-10-24 MED ORDER — SUCCINYLCHOLINE CHLORIDE 200 MG/10ML IV SOSY
PREFILLED_SYRINGE | INTRAVENOUS | Status: AC
  Filled 2023-10-24: qty 10

## 2023-10-24 MED ORDER — MIDAZOLAM HCL 2 MG/2ML IJ SOLN
INTRAMUSCULAR | Status: DC | PRN
Start: 2023-10-24 — End: 2023-10-24
  Administered 2023-10-24: 2 mg via INTRAVENOUS

## 2023-10-24 MED ORDER — 0.9 % SODIUM CHLORIDE (POUR BTL) OPTIME
TOPICAL | Status: DC | PRN
  Administered 2023-10-24: 1000 mL

## 2023-10-24 MED ORDER — LIDOCAINE-EPINEPHRINE 1 %-1:100000 IJ SOLN
INTRAMUSCULAR | Status: DC | PRN
  Administered 2023-10-24: 10 mL

## 2023-10-24 MED ORDER — ONDANSETRON HCL 4 MG/2ML IJ SOLN
INTRAMUSCULAR | Status: DC | PRN
  Administered 2023-10-24: 4 mg via INTRAVENOUS

## 2023-10-24 MED ORDER — AMOXICILLIN-POT CLAVULANATE 875-125 MG PO TABS: 1.0000 | ORAL_TABLET | Freq: Two times a day (BID) | ORAL | 0 refills | Status: DC

## 2023-10-24 MED ORDER — SODIUM CHLORIDE 0.9 % IV SOLN
Freq: Once | INTRAVENOUS | Status: AC
  Administered 2023-10-24: 500 mL
  Filled 2023-10-24 (×2): qty 10

## 2023-10-24 MED ORDER — GLYCOPYRROLATE PF 0.2 MG/ML IJ SOSY
PREFILLED_SYRINGE | INTRAMUSCULAR | Status: AC
  Filled 2023-10-24: qty 1

## 2023-10-24 MED ORDER — GLYCOPYRROLATE 0.2 MG/ML IJ SOLN
INTRAMUSCULAR | Status: DC | PRN
  Administered 2023-10-24: .2 mg via INTRAVENOUS

## 2023-10-24 MED ORDER — FENTANYL CITRATE (PF) 250 MCG/5ML IJ SOLN
INTRAMUSCULAR | Status: AC
  Filled 2023-10-24: qty 5

## 2023-10-24 MED ORDER — DEXAMETHASONE SODIUM PHOSPHATE 10 MG/ML IJ SOLN
INTRAMUSCULAR | Status: DC | PRN
  Administered 2023-10-24: 10 mg via INTRAVENOUS

## 2023-10-24 MED ORDER — ACETAMINOPHEN 500 MG PO TABS: 500.0000 mg | ORAL_TABLET | Freq: Four times a day (QID) | ORAL | 0 refills | Status: AC

## 2023-10-24 MED ORDER — LIDOCAINE-EPINEPHRINE 1 %-1:100000 IJ SOLN
INTRAMUSCULAR | Status: AC
  Filled 2023-10-24: qty 1

## 2023-10-24 MED ORDER — ORAL CARE MOUTH RINSE: 15.0000 mL | Freq: Once | OROMUCOSAL | Status: AC

## 2023-10-24 MED ORDER — EPHEDRINE 5 MG/ML INJ
INTRAVENOUS | Status: AC
  Filled 2023-10-24: qty 5

## 2023-10-24 MED ORDER — IBUPROFEN 600 MG PO TABS: 600.0000 mg | ORAL_TABLET | Freq: Four times a day (QID) | ORAL | 0 refills | Status: AC

## 2023-10-24 MED ORDER — LIDOCAINE 2% (20 MG/ML) 5 ML SYRINGE
INTRAMUSCULAR | Status: AC
  Filled 2023-10-24: qty 5

## 2023-10-24 MED ORDER — DEXAMETHASONE SODIUM PHOSPHATE 10 MG/ML IJ SOLN
INTRAMUSCULAR | Status: AC
  Filled 2023-10-24: qty 1

## 2023-10-24 SURGICAL SUPPLY — 59 items
BAG DECANTER FOR FLEXI CONT (MISCELLANEOUS) ×1 IMPLANT
BLADE CLIPPER SURG (BLADE) IMPLANT
BLADE SURG 15 STRL LF DISP TIS (BLADE) ×3 IMPLANT
CANISTER SUCT 3000ML PPV (MISCELLANEOUS) ×1 IMPLANT
CORD BIPOLAR FORCEPS 12FT (ELECTRODE) ×1 IMPLANT
COVER PROBE W GEL 5X96 (DRAPES) ×1 IMPLANT
COVER SURGICAL LIGHT HANDLE (MISCELLANEOUS) ×1 IMPLANT
DERMABOND ADVANCED .7 DNX12 (GAUZE/BANDAGES/DRESSINGS) ×2 IMPLANT
DISSECTOR ROUND CHERRY 3/8 STR (MISCELLANEOUS) IMPLANT
DRAPE INCISE IOBAN 66X45 STRL (DRAPES) ×1 IMPLANT
DRAPE MICROSCOPE LEICA 54X105 (DRAPES) ×1 IMPLANT
DRSG TEGADERM 4X4.75 (GAUZE/BANDAGES/DRESSINGS) ×3 IMPLANT
ELECT COATED BLADE 2.86 ST (ELECTRODE) ×1 IMPLANT
ELECT EMG 18 NIMS (NEUROSURGERY SUPPLIES) ×1 IMPLANT
ELECT REM PT RETURN 9FT ADLT (ELECTROSURGICAL) ×1 IMPLANT
ELECTRODE EMG 18 NIMS (NEUROSURGERY SUPPLIES) ×1 IMPLANT
ELECTRODE REM PT RTRN 9FT ADLT (ELECTROSURGICAL) ×1 IMPLANT
GAUZE 4X4 16PLY ~~LOC~~+RFID DBL (SPONGE) ×1 IMPLANT
GAUZE SPONGE 4X4 12PLY STRL (GAUZE/BANDAGES/DRESSINGS) ×1 IMPLANT
GENERATOR PULSE INSPIRE (Generator) ×1 IMPLANT
GENERATOR PULSE INSPIRE IV (Generator) ×1 IMPLANT
GLOVE BIO SURGEON STRL SZ 6 (GLOVE) ×1 IMPLANT
GLOVE BIO SURGEON STRL SZ7.5 (GLOVE) ×1 IMPLANT
GOWN STRL REUS W/ TWL LRG LVL3 (GOWN DISPOSABLE) ×3 IMPLANT
KIT BASIN OR (CUSTOM PROCEDURE TRAY) ×1 IMPLANT
KIT NEURO ACCESSORY W/WRENCH (MISCELLANEOUS) IMPLANT
KIT TURNOVER KIT B (KITS) ×1 IMPLANT
LEAD SENSING RESP INSPIRE (Lead) ×1 IMPLANT
LEAD SENSING RESP INSPIRE IV (Lead) ×1 IMPLANT
LEAD SLEEP STIM INSPIRE IV/V (Lead) ×1 IMPLANT
LEAD SLEEP STIMULATION INSPIRE (Lead) ×1 IMPLANT
LOOP VASCLR MAXI BLUE 18IN ST (MISCELLANEOUS) ×1 IMPLANT
MARKER SKIN DUAL TIP RULER LAB (MISCELLANEOUS) ×2 IMPLANT
NDL HYPO 25GX1X1/2 BEV (NEEDLE) ×1 IMPLANT
NEEDLE HYPO 25GX1X1/2 BEV (NEEDLE) ×1 IMPLANT
NS IRRIG 1000ML POUR BTL (IV SOLUTION) ×1 IMPLANT
PAD ARMBOARD 7.5X6 YLW CONV (MISCELLANEOUS) ×1 IMPLANT
PASSER CATH 38CM DISP (INSTRUMENTS) ×1 IMPLANT
PENCIL BUTTON HOLSTER BLD 10FT (ELECTRODE) ×1 IMPLANT
POSITIONER HEAD DONUT 9IN (MISCELLANEOUS) ×1 IMPLANT
PROBE NERVE STIMULATOR (NEUROSURGERY SUPPLIES) ×1 IMPLANT
REMOTE CONTROL SLEEP INSPIRE (MISCELLANEOUS) ×1 IMPLANT
SHEARS HARMONIC 9CM CVD (BLADE) ×1 IMPLANT
SUT MNCRL AB 4-0 PS2 18 (SUTURE) ×2 IMPLANT
SUT SILK 2 0 SH CR/8 (SUTURE) ×1 IMPLANT
SUT SILK 2 0 TIES 10X30 (SUTURE) IMPLANT
SUT SILK 3 0 TIES 10X30 (SUTURE) IMPLANT
SUT SILK 3 0SH CR/8 30 (SUTURE) ×1 IMPLANT
SUT SILK 3-0 RB1 30XBRD (SUTURE) ×1 IMPLANT
SUT SILK PERMA 2-0 1X30 RB-1 (SUTURE) ×2 IMPLANT
SUT VIC AB 3-0 SH 27X BRD (SUTURE) ×2 IMPLANT
SUTURE SILK 3-0 RB1 30XBRD (SUTURE) ×2 IMPLANT
SUTURE SILK PEM 2-0 1X30 RB-1 (SUTURE) ×4 IMPLANT
SYR 10ML LL (SYRINGE) ×1 IMPLANT
TAPE CLOTH SURG 4X10 WHT LF (GAUZE/BANDAGES/DRESSINGS) ×1 IMPLANT
TIE VASCULAR MAXI BLUE 18IN ST (MISCELLANEOUS) ×1 IMPLANT
TOWEL GREEN STERILE (TOWEL DISPOSABLE) ×1 IMPLANT
TRAY ENT MC OR (CUSTOM PROCEDURE TRAY) ×1 IMPLANT
VASCULAR TIE MINI RED 18IN STL (MISCELLANEOUS) ×1 IMPLANT

## 2023-10-24 NOTE — Anesthesia Procedure Notes (Addendum)
 Procedure Name: Intubation Date/Time: 10/24/2023 8:40 AM  Performed by: Colbert Coyer, CRNAPre-anesthesia Checklist: Patient identified, Emergency Drugs available, Suction available and Patient being monitored Patient Re-evaluated:Patient Re-evaluated prior to induction Oxygen Delivery Method: Circle System Utilized Preoxygenation: Pre-oxygenation with 100% oxygen Induction Type: IV induction Ventilation: Mask ventilation without difficulty Laryngoscope Size: Mac and 4 Grade View: Grade II Tube type: Oral Tube size: 7.5 mm Number of attempts: 1 Airway Equipment and Method: Stylet Placement Confirmation: ETT inserted through vocal cords under direct vision, positive ETCO2 and breath sounds checked- equal and bilateral Secured at: 23 cm Tube secured with: Tape Dental Injury: Teeth and Oropharynx as per pre-operative assessment

## 2023-10-24 NOTE — Transfer of Care (Signed)
 Immediate Anesthesia Transfer of Care Note  Patient: Johnathan Parsons.  Procedure(s) Performed: INSERTION, HYPOGLOSSAL NERVE STIMULATOR (Right: Neck)  Patient Location: PACU  Anesthesia Type:General  Level of Consciousness: drowsy  Airway & Oxygen Therapy: Patient Spontanous Breathing, Patient connected to face mask oxygen, and oral airway  Post-op Assessment: Report given to RN  Post vital signs: Reviewed and stable  Last Vitals:  Vitals Value Taken Time  BP 136/73 10/24/23 1115  Temp 97.3   Pulse 77 10/24/23 1118  Resp 17 10/24/23 1118  SpO2 100 % 10/24/23 1118  Vitals shown include unfiled device data.  Last Pain:  Vitals:   10/24/23 0630  TempSrc: Oral         Complications: No notable events documented.

## 2023-10-24 NOTE — Discharge Instructions (Signed)
 POST-OPERATIVE INSTRUCTIONS:   Please restart all of your home medications if you take anything on a daily basis.  You can resume regular diet after this procedure.   DIET: Resume normal diet HYGIENE: Please wait until 48 hours after surgery before getting incisions on neck, chest, and torso wet. In the first 48 hours after surgery, will likely need to take sponge baths. WOUND CARE: Please leave pressure dressing on for 48 hours after surgery. Gently place antibiotic ointment over incisions 2 times per day; use clean q-tip. May place a clean bandage over incisions as needed. After 48 hours, you may get incisions wet with warm soap and water, but do not soak the incisions.  Pat area dry gently.  Immediately place antibiotic ointment. Take oral antibiotics as prescribed If skin around incision starts to get red (> 1cm), swollen, and/or more painful, please call the office ACTIVITY: Try to avoid sleeping on the side of your surgery, to the extent possible.   You may walk for exercise starting the day after surgery. For 2 weeks: Do not pick up anything greater than 5 pounds with the hand/arm that's on the same side as the surgery.  After 2 weeks, you may increase weight to 10 pounds.   Consider performing neck rolls 10 clockwise and 10 counterclockwise 3x/day. For 4 weeks, no strenuous activity (running, jogging, lifting weights, gardening, sports) or until cleared by physician.   PAIN MEDICATIONS: You will be prescribed Oxycodone for pain. This medication should be taken only as needed and for severe pain. If you experience itching, let us know by calling the office and we will prescribe an alternative medication  Take Tylenol and Motrin extra strength every 6 hrs staggered 3 hrs apart from each other Avoid aspirin for 7 days after surgery Avoid direct heat (such as heating pads) to incision sites.   May gently place ice over surgery sites as needed.  Please place a thin clean towel over skin  first and then place ice bag over towel.  Ice for 10 minutes at a time only.  POST-OPERATIVE CLINIC APPOINTMENTS: 1 week: suture removal and wound check in the office.  1 month: device activation and wound check in the office. 2.5 months: check in visit to assess usage. 3-4 months: titration sleep study based on usage of >4 hrs/night.  4 months: final wound check in the office.  Yearly: device check at office.  SCAR CARE: After incisions have healed, you will have a scar, which will continue to evolve over the course of 12 months.  Caring for your incision scars will help them to be as minimal as possible. If you are out in the sun with incision exposure, please remember to place sunscreen over the incision and surrounding skin.   You may use vitamin E or "Scar ointment/cream" to help soften scar.  Please wait one month after surgery before starting this.

## 2023-10-24 NOTE — Op Note (Signed)
 Operative Report                                                            SURGEON: Ashok Croon, MD  ASSISTANT: Eyvonne Mechanic, PA-C  PREOPERATIVE DIAGNOSIS: Obstructive sleep apnea. CPAP Intolerance BMI 28.87  POSTOPERATIVE DIAGNOSIS: Obstructive sleep apnea. CPAP Intolerance BMI 28.87  ANESTHESIA: General endotracheal.  ESTIMATED BLOOD LOSS: Less than 15 ml.  SPECIMENS: None.  DRAINS: None.  COMPLICATIONS: None.  Procedure: 12th cranial nerve (hypoglossal) stimulation implant along with placement of right pleural respiration sensor.  Electronic analysis of the implanted neurostimulator pulse generator system  Pre-Op Diagnosis: Moderate /Severe obstructive sleep apnea with positive airway pressure intolerance (ICD-10 G47.33). Post-Op Diagnosis: Moderate /Severe obstructive sleep apnea with positive pressure airway intolerance (ICD-10 G47.33).   Anesthesia: General endotracheal  Complications: None Brief Clinical History:     This is a 48 year old patient with a history of moderate /severe obstructive sleep apnea, who is intolerant and unable to achieve benefit from positive pressure therapy. Patient has passed the clinical, polysomnographic, and endoscopic screening criteria and presents today for the implant.    Procedure Description: The patient was brought to the Operating Room and was anesthetized via general endotracheal anesthesia without complication.  A shoulder roll was placed and the patient was prepped and draped in usual sterile fashion with the head turned to the left. Prior to prepping and draping, electrodes were placed in the genioglossus and styloglossus muscle and connected to the NIM box for intraoperative nerve monitoring.  A submental incision was made in the right upper neck approximately 2 cm below the mandible in the natural skin crease. Dissection was carried down through the subcutaneous tissue and platysma. The inferior border of the  submandibular gland was identified as well as the digastric tendon. The submandibular gland and the overlying fascia with the marginal mandibular nerve were retracted superiorly.  The digastric was retracted inferiorly.  Dissection was carried down into the digastric triangle where the hypoglossal nerve was identified in its usual fashion.   The mylohyoid muscle was retracted anteriorally, and the hypoglossal nerve was dissected up towards the floor of the mouth.  The lateral branches to retrusor muscles were identified, and tested intra-operatively using the NIM stimulator.  The cuff electrode for the hypoglossal nerve stimulator was placed distally to these branches on the medial nerve branch to the genioglossus muscle. Diagnostic evaluation confirmed activation of the genioglossus nerve, resulting in genioglossal activation and tongue protrusion, confirmed both visually and on NIM monitor. The stimulation electrode was then secured to the digastric tendon on its lateral surface with the provided anchor.  A second 5 cm incision was made in the right upper chest approximately 3 cm below the clavicle.  Dissection was carried down to the pectoralis muscle. An inferior pocket was created deep to the subcutaneous layer and superficial to the pectoralis major muscle.  In the upper portion of our chest pocket, pectoralis muscle fibers were then divided until we encountered anterior chest wall. External intercostal muscle was visualized and dissected until internal intercostal was visualized. Our respiratory sense lead was then tunneled in the fascial plane between external and internal intercostals. The distal anchor was secured to the anterior chest wall using 3-2.0 silk sutures and the proximal anchor was secured to the pec major  facscia.  The stimulation lead was then tunneled in a subplatysmal plane and brought out into the sub-clavicular pocket.  Pulse generator was then brought onto the field and the sense and  stimulation pins were both connected to the appropriate headers and secured using a torque screwdriver. The implantable pulse generator was placed in the subclavicular pocket and secured loosely to the pectoralis fascia using non-resorbable sutures.    Diagnostic evaluation of the system was run, confirming good respiratory wave from the sensing lead, good anterior tongue protrusion with stimulation, and normal impedence measurements.  All the wounds were thoroughly irrigated with bacitracin irrigation.  The wounds were then closed in multiple layers with deep Vicryl sutures and a 5-0 biosyn.  Dermabond was then applied to the skin.    The patient was then awakened, extubated, and transferred to Recovery Room in stable condition. All counts correct x2.  I was present for and performed the entire procedure.  The advanced practice practitioner (APP) assisted throughout the case. The APP was essential in retraction and counter traction when needed to make the case progress smoothly. This retraction and assistance made it possible to see the tissue planes for the procedure. The assistance was needed for blood control, tissue re-approximation and assisted with closure of the incision site

## 2023-10-24 NOTE — H&P (Signed)
 Johnathan Parsons [employer]. is an 48 y.o. male.    Chief Complaint:  OSA CPAP intolerance   HPI: Patient presents today for planned elective procedure.  He denies any interval change in history since office visit on 07/12/23.   Past Medical History:  Diagnosis Date   Dyspnea    Hepatic steatosis    High cholesterol    History of kidney stones    Hypertension    Sleep apnea    Ventral hernia     Past Surgical History:  Procedure Laterality Date   COLONOSCOPY     DRUG INDUCED ENDOSCOPY N/A 08/24/2023   Procedure: DRUG INDUCED SLEEP ENDOSCOPY;  Surgeon: Ashok Croon, MD;  Location: [facility] OR;  Service: ENT;  Laterality: N/A;   FRACTURE SURGERY Left 1988   Leg/ankle   VASECTOMY     WISDOM TOOTH EXTRACTION      Family History  Problem Relation Age of Onset   Hypertension Father    Diabetes Father    Heart disease Father    Colon polyps Father    Liver disease Father    Sleep apnea Father    Hypertension Maternal Grandmother    Heart disease Maternal Grandmother    Liver cancer Maternal Grandmother    Kidney disease Maternal Grandmother    Sleep apnea Maternal Grandmother    Hypertension Maternal Grandfather    Prostate cancer Maternal Grandfather    Colon cancer Maternal Grandfather    Hypertension Paternal Grandmother    Hypertension Paternal Grandfather    Pancreatic cancer Paternal Grandfather    Gallbladder disease Neg Hx    Esophageal cancer Neg Hx     Social History:  reports that he has been smoking e-cigarettes. He has never used smokeless tobacco. He reports that he does not currently use alcohol after a past usage of about 1.0 standard drink of alcohol per week. He reports that he does not use drugs.  Allergies:  Allergies  Allergen Reactions   Oxycodone-Acetaminophen Itching   Sulfa Antibiotics Hives    Medications Prior to Admission  Medication Sig Dispense Refill   atorvastatin (LIPITOR) 20 MG tablet Take 1 tablet (20 mg total) by mouth daily. 90 tablet 1    diclofenac (VOLTAREN) 75 MG EC tablet Take 1 tablet by mouth twice daily (Patient taking differently: Take 75 mg by mouth daily as needed for moderate pain (pain score 4-6).) 60 tablet 0   lisinopril (ZESTRIL) 5 MG tablet Take 1 tablet (5 mg total) by mouth daily. 90 tablet 1   Multiple Vitamin (MULTIVITAMIN PO) Take 1 tablet by mouth daily.     sertraline (ZOLOFT) 50 MG tablet Take 1 tablet by mouth once daily 90 tablet 0   albuterol (VENTOLIN HFA) 108 (90 Base) MCG/ACT inhaler Inhale 2 puffs every 4 hours as needed for wheezing 1 each 2   cetirizine (ZYRTEC) 10 MG tablet Take 1 tablet (10 mg total) by mouth daily. (Patient not taking: Reported on 07/12/2023) 30 tablet 0   famotidine (PEPCID) 40 MG tablet Take 1 tablet (40 mg total) by mouth daily. (Patient not taking: Reported on 07/12/2023) 30 tablet 2   fluticasone (FLONASE) 50 MCG/ACT nasal spray Place 2 sprays into both nostrils daily. (Patient not taking: Reported on 08/13/2023) 16 g 0   lidocaine (XYLOCAINE) 2 % solution Use as directed 10 mLs in the mouth or throat every 3 (three) hours as needed. (Patient not taking: Reported on 08/13/2023) 100 mL 0   promethazine-dextromethorphan (PROMETHAZINE-DM) 6.25-15 MG/5ML syrup Take  5 mLs by mouth 4 (four) times daily as needed. (Patient not taking: Reported on 07/12/2023) 118 mL 0   sildenafil (VIAGRA) 100 MG tablet Take 1/2-1 tablet po one hour before relations. No more than one per day. (Patient not taking: Reported on 07/12/2023) 8 tablet 6    No results found for this or any previous visit (from the past 48 hours). No results found.  ROS: Review of Systems  Constitutional:  Negative for chills and fever.  HENT:  Negative for congestion and sinus pain.   Respiratory:  Negative for cough, wheezing and stridor.   Cardiovascular:  Negative for chest pain.  Gastrointestinal:  Negative for nausea and vomiting.  Genitourinary:  Negative for dysuria.  Musculoskeletal:  Negative for neck pain.   Skin:  Negative for rash.  Neurological:  Negative for headaches.  Psychiatric/Behavioral:  The patient does not have insomnia.     Blood pressure 126/77, pulse 61, temperature 97.9 F (36.6 C), temperature source Oral, resp. rate 17, height 5\' 11"  (1.803 m), weight 93.9 kg, SpO2 97%.  PHYSICAL EXAM: Physical Exam Constitutional:      General: He is not in acute distress.    Appearance: Normal appearance.  HENT:     Head: Normocephalic and atraumatic.     Right Ear: External ear normal.     Left Ear: External ear normal.     Mouth/Throat:     Mouth: Mucous membranes are moist.  Eyes:     Extraocular Movements: Extraocular movements intact.     Pupils: Pupils are equal, round, and reactive to light.  Cardiovascular:     Rate and Rhythm: Normal rate.  Pulmonary:     Effort: No respiratory distress.     Breath sounds: No stridor.  Abdominal:     General: Abdomen is flat.  Musculoskeletal:        General: No swelling.  Lymphadenopathy:     Cervical: No cervical adenopathy.  Skin:    General: Skin is warm and dry.  Neurological:     General: No focal deficit present.     Mental Status: He is alert and oriented to person, place, and time.  Psychiatric:        Mood and Affect: Mood normal.     Assessment/Plan OSA and CPAP intolerance   - risks and benefits discussed and he would like to proceed with Inspire Implant surgery      [name] 10/24/2023, 8:20 AM

## 2023-10-24 NOTE — Anesthesia Postprocedure Evaluation (Signed)
 Anesthesia Post Note  Patient: Johnathan Parsons.  Procedure(s) Performed: INSERTION, HYPOGLOSSAL NERVE STIMULATOR (Right: Neck)     Patient location during evaluation: PACU Anesthesia Type: General Level of consciousness: awake and alert Pain management: pain level controlled Vital Signs Assessment: post-procedure vital signs reviewed and stable Respiratory status: spontaneous breathing, nonlabored ventilation, respiratory function stable and patient connected to nasal cannula oxygen Cardiovascular status: blood pressure returned to baseline and stable Postop Assessment: no apparent nausea or vomiting Anesthetic complications: no  No notable events documented.  Last Vitals:  Vitals:   10/24/23 1130 10/24/23 1145  BP: 128/80 126/76  Pulse: 65 66  Resp: 13 12  Temp:  (!) 36.4 C  SpO2: 99% 99%    Last Pain:  Vitals:   10/24/23 1145  TempSrc:   PainSc: 0-No pain                 Trevor Iha

## 2023-10-25 ENCOUNTER — Encounter (HOSPITAL_COMMUNITY): Payer: Self-pay | Admitting: Otolaryngology

## 2023-11-07 ENCOUNTER — Ambulatory Visit (INDEPENDENT_AMBULATORY_CARE_PROVIDER_SITE_OTHER): Payer: Commercial Managed Care - PPO | Admitting: Otolaryngology

## 2023-11-07 ENCOUNTER — Encounter (INDEPENDENT_AMBULATORY_CARE_PROVIDER_SITE_OTHER): Payer: Self-pay | Admitting: Otolaryngology

## 2023-11-07 VITALS — BP 148/76 | HR 77

## 2023-11-07 DIAGNOSIS — Z9889 Other specified postprocedural states: Secondary | ICD-10-CM

## 2023-11-07 DIAGNOSIS — G4733 Obstructive sleep apnea (adult) (pediatric): Secondary | ICD-10-CM

## 2023-11-07 DIAGNOSIS — Z789 Other specified health status: Secondary | ICD-10-CM

## 2023-11-07 NOTE — Progress Notes (Signed)
 ENT Progress Note   Discussed the use of AI scribe software for clinical note transcription with the patient, who gave verbal consent to proceed.  History of Present Illness   Johnathan Parsons. "Johnathan Parsons" is a 48 year old male who presents for post-op visit s/p Inspire implant 10/24/23  He experiences soreness in the jaw and chin area following recent surgery. The soreness occurs with certain movements, such as turning or talking. It is not constant and varies with activity.  He describes a sensation of skin numbness around the surgical site, which he believes will take a couple of months to recover. No issues with severe pain or tongue movement, although the area feels different, particularly when shaving.  He works in [employer], which requires frequent upward looking, and suspects this contributes to the soreness by stretching the area.      Exam: CN XII intact Incisions at right chin and right upper chest C/D/I healing as expected no ecchymosis or swelling    Assessment and Plan    Postoperative recovery is as expected 2 weeks post-Inspire implant  He is experiencing common postoperative symptoms, including jaw and chin soreness, tightness. He reports skin numbness, which is expected to recover over a few months. 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 - Advise gentle neck stretches to aid recovery. - Instruct to avoid direct sunlight and apply sunscreen to the surgical area. - Recommend silicone scar cream to minimize scarring, starting in about a week. - Advised light duty until activation of Inspire implant and gradual increase in activity level. - Schedule follow-up if the small chin scar area remains raised after 2-3 months for potential scar revision.  Follow-up A follow-up appointment is scheduled for activation on April 21st. Further follow-up will depend on the scar healing

## 2023-11-10 ENCOUNTER — Other Ambulatory Visit: Payer: Self-pay | Admitting: Family Medicine

## 2023-11-28 ENCOUNTER — Encounter (INDEPENDENT_AMBULATORY_CARE_PROVIDER_SITE_OTHER): Payer: Self-pay | Admitting: Otolaryngology

## 2023-12-11 ENCOUNTER — Ambulatory Visit
Admission: EM | Admit: 2023-12-11 | Discharge: 2023-12-11 | Disposition: A | Discharge status: Home / Self Care | Attending: Family Medicine | Admitting: Family Medicine

## 2023-12-11 DIAGNOSIS — U071 COVID-19: Secondary | ICD-10-CM

## 2023-12-11 LAB — POC COVID19/FLU A&B COMBO
Covid Antigen, POC: POSITIVE — AB
Influenza A Antigen, POC: NEGATIVE
Influenza B Antigen, POC: NEGATIVE

## 2023-12-11 MED ORDER — BENZONATATE 100 MG PO CAPS: 100.0000 mg | ORAL_CAPSULE | Freq: Three times a day (TID) | ORAL | 0 refills | Status: AC | PRN

## 2023-12-11 MED ORDER — PAXLOVID (300/100) 20 X 150 MG & 10 X 100MG PO TBPK: 3.0000 | ORAL_TABLET | Freq: Two times a day (BID) | ORAL | 0 refills | Status: AC

## 2023-12-11 NOTE — ED Provider Notes (Signed)
 [facility]    CSN: [id number] Arrival date & time: 12/11/23  0913      History   Chief Complaint No chief complaint on file.   HPI Johnathan Parsons. is a 48 y.o. male.   Patient presents today for 1 day history of tactile fever, body aches, chills, congested cough, stuffy and runny nose, sore throat, headache, decreased appetite, and fatigue.  He denies shortness of breath, chest pain, chest tightness, ear pain, abdominal pain, nausea/vomiting, diarrhea, and known sick contacts.  Has taken Dayquil and Nyquil for symptoms without much improvement.     Past Medical History:  Diagnosis Date   Dyspnea    Hepatic steatosis    High cholesterol    History of kidney stones    Hypertension    Sleep apnea    Ventral hernia     Patient Active Problem List   Diagnosis Date Noted   BMI 28.0-28.9,adult 10/24/2023   Intolerance of continuous positive airway pressure (CPAP) ventilation 08/24/2023   Respiratory infection 05/22/2023   Morbid obesity (HCC) 03/06/2022   Fatty liver 03/06/2022   Hyperlipidemia 03/14/2018   OSA (obstructive sleep apnea) 03/11/2018   Dyslipidemia 10/20/2015   HTN (hypertension) 06/11/2014   Tobacco use disorder 06/11/2014    Past Surgical History:  Procedure Laterality Date   COLONOSCOPY     DRUG INDUCED ENDOSCOPY N/A 08/24/2023   Procedure: DRUG INDUCED SLEEP ENDOSCOPY;  Surgeon: Artice Last, MD;  Location: [facility] OR;  Service: ENT;  Laterality: N/A;   FRACTURE SURGERY Left 1988   Leg/ankle   IMPLANTATION OF HYPOGLOSSAL NERVE STIMULATOR Right 10/24/2023   Procedure: INSERTION, HYPOGLOSSAL NERVE STIMULATOR;  Surgeon: Artice Last, MD;  Location: [facility] OR;  Service: ENT;  Laterality: Right;   VASECTOMY     WISDOM TOOTH EXTRACTION         Home Medications    Prior to Admission medications   Medication Sig Start Date End Date Taking? Authorizing Provider  benzonatate  (TESSALON ) 100 MG capsule Take 1 capsule (100 mg total) by  mouth 3 (three) times daily as needed for cough. Do not take with alcohol or while operating or driving heavy machinery 09/19/84  Yes Thena Fireman A, NP  nirmatrelvir/ritonavir (PAXLOVID , 300/100,) 20 x 150 MG & 10 x 100MG  TBPK Take 3 tablets by mouth 2 (two) times daily for 5 days. Patient GFR is 60.  Take nirmatrelvir (150 mg) two tablets twice daily for 5 days and ritonavir (100 mg) one tablet twice daily for 5 days. 12/11/23 12/16/23 Yes Wilhemena Harbour, NP  acetaminophen  (TYLENOL ) 500 MG tablet Take 1 tablet (500 mg total) by mouth every 6 (six) hours. Take every 6 hrs x 2-3 days then take every 6 hrs as needed 10/24/23   Soldatova, Liuba, MD  albuterol  (VENTOLIN  HFA) 108 (90 Base) MCG/ACT inhaler Inhale 2 puffs every 4 hours as needed for wheezing 08/28/22   Bennet Brasil, MD  atorvastatin  (LIPITOR) 20 MG tablet Take 1 tablet by mouth once daily 11/12/23   Bennet Brasil, MD  cetirizine  (ZYRTEC ) 10 MG tablet Take 1 tablet (10 mg total) by mouth daily. Patient not taking: Reported on 07/12/2023 06/26/23   Leath-Warren, Belen Bowers, NP  diclofenac  (VOLTAREN ) 75 MG EC tablet Take 1 tablet by mouth twice daily Patient taking differently: Take 75 mg by mouth daily as needed for moderate pain (pain score 4-6). 05/26/23   Bennet Brasil, MD  famotidine  (PEPCID ) 40 MG tablet Take 1 tablet (40 mg  total) by mouth daily. 02/08/23   Bennet Brasil, MD  fluticasone  (FLONASE ) 50 MCG/ACT nasal spray Place 2 sprays into both nostrils daily. 06/26/23   Leath-Warren, Belen Bowers, NP  ibuprofen  (ADVIL ) 600 MG tablet Take 1 tablet (600 mg total) by mouth every 6 (six) hours. Please take every 6 hrs, and stagger this medication 3 hrs apart from Tylenol  10/24/23   Soldatova, Liuba, MD  lidocaine  (XYLOCAINE ) 2 % solution Use as directed 10 mLs in the mouth or throat every 3 (three) hours as needed. 05/18/23   Corbin Dess, PA-C  lisinopril  (ZESTRIL ) 5 MG tablet Take 1 tablet by mouth once daily 11/12/23   Bennet Brasil, MD  Multiple Vitamin (MULTIVITAMIN PO) Take 1 tablet by mouth daily.    [provider]  oxyCODONE  (ROXICODONE ) 5 MG immediate release tablet Take 1 tablet (5 mg total) by mouth every 6 (six) hours as needed for severe pain (pain score 7-10). Patient not taking: Reported on 11/07/2023 10/24/23   Soldatova, Liuba, MD  sertraline  (ZOLOFT ) 50 MG tablet Take 1 tablet by mouth once daily 10/04/23   Bennet Brasil, MD  sildenafil  (VIAGRA ) 100 MG tablet Take 1/2-1 tablet po one hour before relations. No more than one per day. 01/08/23   Bennet Brasil, MD    Family History Family History  Problem Relation Age of Onset   Hypertension Father    Diabetes Father    Heart disease Father    Colon polyps Father    Liver disease Father    Sleep apnea Father    Hypertension Maternal Grandmother    Heart disease Maternal Grandmother    Liver cancer Maternal Grandmother    Kidney disease Maternal Grandmother    Sleep apnea Maternal Grandmother    Hypertension Maternal Grandfather    Prostate cancer Maternal Grandfather    Colon cancer Maternal Grandfather    Hypertension Paternal Grandmother    Hypertension Paternal Grandfather    Pancreatic cancer Paternal Grandfather    Gallbladder disease Neg Hx    Esophageal cancer Neg Hx     Social History Social History   Tobacco Use   Smoking status: Every Day    Types: E-cigarettes   Smokeless tobacco: Never  Vaping Use   Vaping status: Every Day   Start date: 09/17/2017   Devices: juul  Substance Use Topics   Alcohol use: Not Currently    Alcohol/week: 1.0 standard drink of alcohol    Types: 1 Glasses of wine per week    Comment: socially   Drug use: No     Allergies   Oxycodone -acetaminophen  and Sulfa antibiotics   Review of Systems Review of Systems Per HPI  Physical Exam Triage Vital Signs ED Triage Vitals  Encounter Vitals Group     BP 12/11/23 0919 116/82     Systolic BP Percentile --      Diastolic BP  Percentile --      Pulse Rate 12/11/23 0919 82     Resp 12/11/23 0919 18     Temp 12/11/23 0919 99.6 F (37.6 C)     Temp Source 12/11/23 0919 Oral     SpO2 12/11/23 0919 95 %     Weight --      Height --      Head Circumference --      Peak Flow --      Pain Score 12/11/23 0920 7     Pain Loc --  Pain Education --      Exclude from Growth Chart --    No data found.  Updated Vital Signs BP 116/82 (BP Location: Right Arm)   Pulse 82   Temp 99.6 F (37.6 C) (Oral)   Resp 18   SpO2 95%   Visual Acuity Right Eye Distance:   Left Eye Distance:   Bilateral Distance:    Right Eye Near:   Left Eye Near:    Bilateral Near:     Physical Exam Vitals and nursing note reviewed.  Constitutional:      General: He is not in acute distress.    Appearance: Normal appearance. He is not ill-appearing or toxic-appearing.  HENT:     Head: Normocephalic and atraumatic.     Right Ear: Tympanic membrane, ear canal and external ear normal.     Left Ear: Tympanic membrane, ear canal and external ear normal.     Nose: Congestion present. No rhinorrhea.     Mouth/Throat:     Mouth: Mucous membranes are moist.     Pharynx: Oropharynx is clear. No oropharyngeal exudate or posterior oropharyngeal erythema.  Eyes:     General: No scleral icterus.    Extraocular Movements: Extraocular movements intact.  Cardiovascular:     Rate and Rhythm: Normal rate and regular rhythm.  Pulmonary:     Effort: Pulmonary effort is normal. No respiratory distress.     Breath sounds: Normal breath sounds. No wheezing, rhonchi or rales.  Musculoskeletal:     Cervical back: Normal range of motion and neck supple.  Lymphadenopathy:     Cervical: No cervical adenopathy.  Skin:    General: Skin is warm and dry.     Coloration: Skin is not jaundiced or pale.     Findings: No erythema or rash.  Neurological:     Mental Status: He is alert and oriented to person, place, and time.  Psychiatric:         Behavior: Behavior is cooperative.      UC Treatments / Results  Labs (all labs ordered are listed, but only abnormal results are displayed) Labs Reviewed  POC COVID19/FLU A&B COMBO - Abnormal; Notable for the following components:      Result Value   Covid Antigen, POC Positive (*)    All other components within normal limits    EKG   Radiology No results found.  Procedures Procedures (including critical care time)  Medications Ordered in UC Medications - No data to display  Initial Impression / Assessment and Plan / UC Course  I have reviewed the triage vital signs and the nursing notes.  Pertinent labs & imaging results that were available during my care of the patient were reviewed by me and considered in my medical decision making (see chart for details).   Patient is well-appearing, normotensive, afebrile, not tachycardic, not tachypneic, oxygenating well on room air.    1. COVID-19 Vitals and examination are reassuring  Symptoms are consistent with mild COVID-19 Patient is a candidate for Paxlovid ; recent GFR greater than 60, prescription sent to pharmacy Other supportive care discussed including guaifenesin, cough suppressant medication as needed Return and ER precautions discussed Work excuse provided  The patient was given the opportunity to ask questions.  All questions answered to their satisfaction.  The patient is in agreement to this plan.   Final Clinical Impressions(s) / UC Diagnoses   Final diagnoses:  COVID-19     Discharge Instructions      You  have COVID-19.  Take Paxlovid  to prevent severe disease.  Symptoms should improve over the next week to 10 days.  If you develop chest pain or shortness of breath, go to the emergency room.  Do not take oxycodone , Viagra , Lipitor while on Paxlovid .  Resume Lipitor 3 days after completing Paxlovid  course.  Some things that can make you feel better are: - Increased rest - Increasing fluid with  water/sugar free electrolytes - Acetaminophen  and ibuprofen  as needed for fever/pain - Salt water gargling, chloraseptic spray and throat lozenges - OTC guaifenesin (Mucinex) 600 mg twice daily for congestion - Saline sinus flushes or a neti pot - Humidifying the air -Tessalon  Perles every 8 hours as needed for dry cough      ED Prescriptions     Medication Sig Dispense Auth. Provider   benzonatate  (TESSALON ) 100 MG capsule Take 1 capsule (100 mg total) by mouth 3 (three) times daily as needed for cough. Do not take with alcohol or while operating or driving heavy machinery 21 capsule Thena Fireman A, NP   nirmatrelvir/ritonavir (PAXLOVID , 300/100,) 20 x 150 MG & 10 x 100MG  TBPK Take 3 tablets by mouth 2 (two) times daily for 5 days. Patient GFR is 60.  Take nirmatrelvir (150 mg) two tablets twice daily for 5 days and ritonavir (100 mg) one tablet twice daily for 5 days. 30 tablet Wilhemena Harbour, NP      PDMP not reviewed this encounter.   Wilhemena Harbour, NP 12/11/23 1420

## 2023-12-11 NOTE — ED Triage Notes (Signed)
 Pt reports he has some nasal congestion, body aches, sore throat, and a headache x 1 day

## 2023-12-11 NOTE — Discharge Instructions (Addendum)
 You have COVID-19.  Take Paxlovid  to prevent severe disease.  Symptoms should improve over the next week to 10 days.  If you develop chest pain or shortness of breath, go to the emergency room.  Do not take oxycodone , Viagra , Lipitor while on Paxlovid .  Resume Lipitor 3 days after completing Paxlovid  course.  Some things that can make you feel better are: - Increased rest - Increasing fluid with water/sugar free electrolytes - Acetaminophen  and ibuprofen  as needed for fever/pain - Salt water gargling, chloraseptic spray and throat lozenges - OTC guaifenesin (Mucinex) 600 mg twice daily for congestion - Saline sinus flushes or a neti pot - Humidifying the air -Tessalon  Perles every 8 hours as needed for dry cough

## 2023-12-12 ENCOUNTER — Encounter: Payer: Self-pay | Admitting: Family Medicine

## 2024-01-30 ENCOUNTER — Other Ambulatory Visit: Payer: Self-pay | Admitting: Family Medicine

## 2024-02-01 ENCOUNTER — Encounter: Payer: Self-pay | Admitting: Family Medicine

## 2024-02-08 ENCOUNTER — Other Ambulatory Visit: Payer: Self-pay | Admitting: Family Medicine

## 2024-04-29 ENCOUNTER — Other Ambulatory Visit: Payer: Self-pay | Admitting: Family Medicine

## 2024-04-29 ENCOUNTER — Encounter: Payer: Self-pay | Admitting: Family Medicine

## 2024-05-11 ENCOUNTER — Ambulatory Visit
Admission: EM | Admit: 2024-05-11 | Discharge: 2024-05-11 | Disposition: A | Discharge status: Home / Self Care | Attending: Family Medicine | Admitting: Family Medicine

## 2024-05-11 DIAGNOSIS — J069 Acute upper respiratory infection, unspecified: Secondary | ICD-10-CM

## 2024-05-11 LAB — POC COVID19/FLU A&B COMBO
Covid Antigen, POC: NEGATIVE
Influenza A Antigen, POC: NEGATIVE
Influenza B Antigen, POC: NEGATIVE

## 2024-05-11 MED ORDER — AZELASTINE HCL 0.1 % NA SOLN: 1.0000 | Freq: Two times a day (BID) | NASAL | 0 refills | Status: AC

## 2024-05-11 MED ORDER — PROMETHAZINE-DM 6.25-15 MG/5ML PO SYRP: 5.0000 mL | ORAL_SOLUTION | Freq: Four times a day (QID) | ORAL | 0 refills | Status: AC | PRN

## 2024-05-11 NOTE — ED Provider Notes (Signed)
 [facility]    CSN: [id number] Arrival date & time: 05/11/24  1224      History   Chief Complaint No chief complaint on file.   HPI Johnathan Parsons. is a 48 y.o. male.   Patient presenting today with 1 day history of sinus pressure and congestion, chest congestion, cough, fatigue, headache.  Denies chest pain, shortness of breath, abdominal pain, vomiting, diarrhea.  So far not trying anything over-the-counter for symptoms.  No known history of chronic pulmonary disease.    Past Medical History:  Diagnosis Date   Dyspnea    Hepatic steatosis    High cholesterol    History of kidney stones    Hypertension    Sleep apnea    Ventral hernia     Patient Active Problem List   Diagnosis Date Noted   BMI 28.0-28.9,adult 10/24/2023   Intolerance of continuous positive airway pressure (CPAP) ventilation 08/24/2023   Respiratory infection 05/22/2023   Morbid obesity (HCC) 03/06/2022   Fatty liver 03/06/2022   Hyperlipidemia 03/14/2018   OSA (obstructive sleep apnea) 03/11/2018   Dyslipidemia 10/20/2015   HTN (hypertension) 06/11/2014   Tobacco use disorder 06/11/2014    Past Surgical History:  Procedure Laterality Date   COLONOSCOPY     DRUG INDUCED ENDOSCOPY N/A 08/24/2023   Procedure: DRUG INDUCED SLEEP ENDOSCOPY;  Surgeon: Okey Burns, MD;  Location: [facility] OR;  Service: ENT;  Laterality: N/A;   FRACTURE SURGERY Left 1988   Leg/ankle   IMPLANTATION OF HYPOGLOSSAL NERVE STIMULATOR Right 10/24/2023   Procedure: INSERTION, HYPOGLOSSAL NERVE STIMULATOR;  Surgeon: Okey Burns, MD;  Location: [facility] OR;  Service: ENT;  Laterality: Right;   VASECTOMY     WISDOM TOOTH EXTRACTION         Home Medications      Family History Family History  Problem Relation Age of Onset   Hypertension Father    Diabetes Father    Heart disease Father    Colon polyps Father    Liver disease Father    Sleep apnea Father    Hypertension Maternal Grandmother     Heart disease Maternal Grandmother    Liver cancer Maternal Grandmother    Kidney disease Maternal Grandmother    Sleep apnea Maternal Grandmother    Hypertension Maternal Grandfather    Prostate cancer Maternal Grandfather    Colon cancer Maternal Grandfather    Hypertension Paternal Grandmother    Hypertension Paternal Grandfather    Pancreatic cancer Paternal Grandfather    Gallbladder disease Neg Hx    Esophageal cancer Neg Hx     Social History Social History   Tobacco Use   Smoking status: Every Day    Types: E-cigarettes   Smokeless tobacco: Never  Vaping Use   Vaping status: Every Day   Start date: 09/17/2017   Devices: juul  Substance Use Topics   Alcohol use: Not Currently    Alcohol/week: 1.0 standard drink of alcohol    Types: 1 Glasses of wine per week    Comment: socially   Drug use: No     Allergies   Oxycodone -acetaminophen  and Sulfa antibiotics   Review of Systems Review of Systems Per HPI  Physical Exam Triage Vital Signs ED Triage Vitals  Encounter Vitals Group     BP 05/11/24 1355 119/78     Girls Systolic BP Percentile --      Girls Diastolic BP Percentile --      Boys Systolic BP Percentile --  Boys Diastolic BP Percentile --      Pulse Rate 05/11/24 1355 79     Resp 05/11/24 1355 20     Temp 05/11/24 1355 98.4 F (36.9 C)     Temp Source 05/11/24 1355 Oral     SpO2 05/11/24 1355 95 %     Weight --      Height --      Head Circumference --      Peak Flow --      Pain Score 05/11/24 1356 0     Pain Loc --      Pain Education --      Exclude from Growth Chart --    No data found.  Updated Vital Signs BP 119/78 (BP Location: Right Arm)   Pulse 79   Temp 98.4 F (36.9 C) (Oral)   Resp 20   SpO2 95%   Visual Acuity Right Eye Distance:   Left Eye Distance:   Bilateral Distance:    Right Eye Near:   Left Eye Near:    Bilateral Near:     Physical Exam Vitals and nursing note reviewed.  Constitutional:       Appearance: He is well-developed.  HENT:     Head: Atraumatic.     Right Ear: External ear normal.     Left Ear: External ear normal.     Nose: Rhinorrhea present.     Mouth/Throat:     Pharynx: Posterior oropharyngeal erythema present. No oropharyngeal exudate.  Eyes:     Conjunctiva/sclera: Conjunctivae normal.     Pupils: Pupils are equal, round, and reactive to light.  Cardiovascular:     Rate and Rhythm: Normal rate and regular rhythm.  Pulmonary:     Effort: Pulmonary effort is normal. No respiratory distress.     Breath sounds: No wheezing or rales.  Musculoskeletal:        General: Normal range of motion.     Cervical back: Normal range of motion and neck supple.  Lymphadenopathy:     Cervical: No cervical adenopathy.  Skin:    General: Skin is warm and dry.  Neurological:     Mental Status: He is alert and oriented to person, place, and time.  Psychiatric:        Behavior: Behavior normal.      UC Treatments / Results  Labs (all labs ordered are listed, but only abnormal results are displayed) Labs Reviewed  POC COVID19/FLU A&B COMBO    EKG   Radiology No results found.  Procedures Procedures (including critical care time)  Medications Ordered in UC Medications - No data to display  Initial Impression / Assessment and Plan / UC Course  I have reviewed the triage vital signs and the nursing notes.  Pertinent labs & imaging results that were available during my care of the patient were reviewed by me and considered in my medical decision making (see chart for details).     Rapid flu and COVID-negative, vitals and exam very reassuring today.  Suspect viral upper respiratory infection.  Treat with Astelin , Phenergan  DM, supportive over-the-counter medications and home care.  Return for worsening symptoms.  Final Clinical Impressions(s) / UC Diagnoses   Final diagnoses:  Viral URI with cough   Discharge Instructions   None    ED Prescriptions      Medication Sig Dispense Auth. Provider   azelastine  (ASTELIN ) 0.1 % nasal spray Place 1 spray into both nostrils 2 (two) times daily. Use in each  nostril as directed 30 mL Stuart Vernell Norris, PA-C   promethazine -dextromethorphan (PROMETHAZINE -DM) 6.25-15 MG/5ML syrup Take 5 mLs by mouth 4 (four) times daily as needed. 100 mL Stuart Vernell Norris, [place]      PDMP not reviewed this encounter.   Stuart Vernell Norris, PA-C 05/11/24 1516

## 2024-05-11 NOTE — ED Triage Notes (Signed)
 Pt reports sinus pressure,  sinus congestion, chest congestion onset this morning.

## 2024-05-15 ENCOUNTER — Ambulatory Visit (INDEPENDENT_AMBULATORY_CARE_PROVIDER_SITE_OTHER): Admitting: Nurse Practitioner

## 2024-05-15 VITALS — BP 132/83 | HR 73 | Wt 210.6 lb

## 2024-05-15 DIAGNOSIS — F419 Anxiety disorder, unspecified: Secondary | ICD-10-CM

## 2024-05-15 DIAGNOSIS — R7301 Impaired fasting glucose: Secondary | ICD-10-CM

## 2024-05-15 DIAGNOSIS — E785 Hyperlipidemia, unspecified: Secondary | ICD-10-CM

## 2024-05-15 DIAGNOSIS — F17200 Nicotine dependence, unspecified, uncomplicated: Secondary | ICD-10-CM

## 2024-05-15 DIAGNOSIS — B9689 Other specified bacterial agents as the cause of diseases classified elsewhere: Secondary | ICD-10-CM

## 2024-05-15 DIAGNOSIS — J069 Acute upper respiratory infection, unspecified: Secondary | ICD-10-CM | POA: Diagnosis not present

## 2024-05-15 MED ORDER — AMOXICILLIN-POT CLAVULANATE 875-125 MG PO TABS: 1.0000 | ORAL_TABLET | Freq: Two times a day (BID) | ORAL | 0 refills | Status: AC

## 2024-05-15 MED ORDER — SERTRALINE HCL 50 MG PO TABS: ORAL_TABLET | ORAL | 1 refills | Status: DC

## 2024-05-16 ENCOUNTER — Encounter: Payer: Self-pay | Admitting: Nurse Practitioner

## 2024-05-16 ENCOUNTER — Ambulatory Visit: Payer: Self-pay | Admitting: Nurse Practitioner

## 2024-05-16 DIAGNOSIS — F419 Anxiety disorder, unspecified: Secondary | ICD-10-CM | POA: Insufficient documentation

## 2024-05-16 LAB — LIPID PANEL
Chol/HDL Ratio: 3.1 ratio (ref 0.0–5.0)
Cholesterol, Total: 114 mg/dL (ref 100–199)
HDL: 37 mg/dL — ABNORMAL LOW (ref >39)
LDL Chol Calc (NIH): 59 mg/dL (ref 0–99)
Triglycerides: 93 mg/dL (ref 0–149)
VLDL Cholesterol Cal: 18 mg/dL (ref 5–40)

## 2024-05-16 LAB — HEMOGLOBIN A1C
Est. average glucose Bld gHb Est-mCnc: 103 mg/dL
Hgb A1c MFr Bld: 5.2 % (ref 4.8–5.6)

## 2024-05-16 NOTE — Progress Notes (Signed)
 Subjective:    Patient ID: Johnathan Parsons., male    DOB: 05-19-1976, 48 y.o.   MRN: [id number]  HPI Discussed the use of AI scribe software for clinical note transcription with the patient, who gave verbal consent to proceed.  History of Present Illness Johnathan Parsons is a 48 year old who presents for follow-up on weight loss and medication management.  He has experienced significant weight loss, decreasing from 231 pounds in October of last year to 210 pounds currently. He attributes this to dietary changes, including reducing natural sugars and using alternatives like Stevia and almond milk. His weight has been stable for several months.  He is currently taking sertraline  50 mg daily, which effectively manages stress and prevents feelings of anger. He reports no need for a dosage change and states that it 'took the edge off.'  He has a history of prediabetes, but his fasting sugars have normalized since his weight loss, dropping from near diabetic levels to below 100. He attributes this improvement to his dietary changes and weight loss.  He reports recent upper respiratory symptoms, including congestion, fatigue, and green nasal discharge, which began about five days ago. No significant coughing, shortness of breath, or wheezing, except for some nighttime coughing due to drainage. He has been using Mucinex, nasal spray, and Nyquil to manage symptoms.  He uses e-cigarettes primarily for nicotine and drinks alcohol socially. He denies any marijuana use.  He is currently on Lipitor for cholesterol management.     Review of Systems  Constitutional:  Positive for fatigue. Negative for fever.  HENT:  Positive for congestion and postnasal drip. Negative for ear pain and sore throat.   Respiratory:  Negative for cough, chest tightness, shortness of breath and wheezing.   Cardiovascular:  Negative for chest pain.      05/15/2024    9:03 AM  Depression screen PHQ 2/9   Decreased Interest 0  Down, Depressed, Hopeless 0  PHQ - 2 Score 0  Altered sleeping 0  Tired, decreased energy 1  Change in appetite 1  Feeling bad or failure about yourself  0  Trouble concentrating 0  Moving slowly or fidgety/restless 0  Suicidal thoughts 0  PHQ-9 Score 2  Difficult doing work/chores Not difficult at all      05/15/2024    9:04 AM 05/22/2023   11:15 AM 05/04/2023    8:34 AM 02/08/2023    8:35 AM  GAD 7 : Generalized Anxiety Score  Nervous, Anxious, on Edge 0 0 0 0  Control/stop worrying 0 0 0 0  Worry too much - different things 0 0 0 0  Trouble relaxing 0 0 0 0  Restless 0 0 0 0  Easily annoyed or irritable 0 0 0 0  Afraid - awful might happen 0 0 0 0  Total GAD 7 Score 0 0 0 0   Social History   Tobacco Use   Smoking status: Every Day    Types: E-cigarettes   Smokeless tobacco: Never  Vaping Use   Vaping status: Every Day   Start date: 09/17/2017   Devices: juul  Substance Use Topics   Alcohol use: Not Currently    Alcohol/week: 1.0 standard drink of alcohol    Types: 1 Glasses of wine per week    Comment: socially   Drug use: No        Objective:   Physical Exam Vitals and nursing note reviewed.  Constitutional:  General: He is not in acute distress. HENT:     Ears:     Comments: TMs clear effusion. No erythema.     Mouth/Throat:     Mouth: Mucous membranes are moist.     Pharynx: No posterior oropharyngeal erythema.     Comments: Cloudy PND noted.  Neck:     Comments: Mild anterior cervical adenopathy.  Cardiovascular:     Rate and Rhythm: Normal rate and regular rhythm.  Pulmonary:     Effort: Pulmonary effort is normal.     Breath sounds: Normal breath sounds.  Musculoskeletal:     Cervical back: Neck supple.  Lymphadenopathy:     Cervical: Cervical adenopathy present.  Skin:    General: Skin is dry.  Neurological:     Mental Status: He is alert and oriented to person, place, and time.  Psychiatric:        Mood  and Affect: Mood normal.        Behavior: Behavior normal.        Thought Content: Thought content normal.    Today's Vitals   05/15/24 0902  BP: 132/83  Pulse: 73  SpO2: 91%  Weight: 210 lb 9.6 oz (95.5 kg)   Body mass index is 29.37 kg/m.       Assessment & Plan:  1. Hyperlipidemia, unspecified hyperlipidemia type  - Lipid panel  2. Elevated fasting glucose  - Hemoglobin A1c  3. Bacterial URI - Prescribed antibiotics for potential bacterial infection. - Advised continuation of Mucinex, nasal spray, and Nyquil. - Instructed to monitor symptoms and report worsening. - Advised follow-up if no improvement by early next week. - amoxicillin -clavulanate (AUGMENTIN ) 875-125 MG tablet; Take 1 tablet by mouth 2 (two) times daily.  Dispense: 14 tablet; Refill: 0  4. Anxiety (Primary) Continue Sertraline  as directed.   5. Tobacco use disorder Discussed potential health risks with nicotine use.   Return in about 6 months (around 11/12/2024).

## 2024-08-25 ENCOUNTER — Encounter: Payer: Self-pay | Admitting: Family Medicine

## 2024-08-26 ENCOUNTER — Other Ambulatory Visit: Payer: Self-pay

## 2024-08-26 DIAGNOSIS — F419 Anxiety disorder, unspecified: Secondary | ICD-10-CM

## 2024-08-26 MED ORDER — SERTRALINE HCL 50 MG PO TABS: ORAL_TABLET | ORAL | 1 refills | Status: AC

## 2024-11-12 ENCOUNTER — Ambulatory Visit: Admitting: Family Medicine
